# Patient Record
Sex: Female | Born: 1980 | Race: Black or African American | Hispanic: No | Marital: Single | Smoking: Current every day smoker
Health system: Southern US, Community
[De-identification: ages and names within clinical notes are randomized; demographics above are authoritative.]

## PROBLEM LIST (undated history)

## (undated) DIAGNOSIS — I10 Essential (primary) hypertension: Secondary | ICD-10-CM

## (undated) HISTORY — PX: BREAST SURGERY: SHX581

---

## 2013-02-05 ENCOUNTER — Ambulatory Visit: Payer: Self-pay | Admitting: Emergency Medicine

## 2013-02-05 VITALS — BP 132/94 | HR 86 | Temp 98.4°F | Resp 20 | Ht 66.0 in | Wt 246.0 lb

## 2013-02-05 DIAGNOSIS — L02219 Cutaneous abscess of trunk, unspecified: Secondary | ICD-10-CM

## 2013-02-05 MED ORDER — SULFAMETHOXAZOLE-TRIMETHOPRIM 800-160 MG PO TABS: 1.0000 | ORAL_TABLET | Freq: Two times a day (BID) | ORAL | Status: DC

## 2013-02-05 MED ORDER — HYDROCODONE-ACETAMINOPHEN 5-325 MG PO TABS: 1.0000 | ORAL_TABLET | ORAL | Status: DC | PRN

## 2013-02-05 NOTE — Progress Notes (Signed)
Right Axillary Abscess Procedural Note   Local anesthesia with 3.5 cc with 2% plain lidocaine  Sterile prep  Incision with 11 blade  Moderate to large thick purulence expressed  Irrigated wound cavity with 1.5 cc 2% plain lidocaine Gently packed with large amount of 1/4 inch plain packing  Cleansed and dressed

## 2013-02-05 NOTE — Progress Notes (Signed)
[facility] [address], [place] [address] [phone]  Date:  02/05/2013   Name:  Gabriella Murphy   DOB:  [birth year]   MRN:  [id number]  PCP:  No primary provider on file.    Chief Complaint: Cyst   History of Present Illness:  Gabriella Murphy is a 32 y.o. very pleasant female patient who presents with the following:  History of prior outbreaks of axillary abscesses.  Has a one week history of painful swelling in right axilla.  No fever or chills.  Spontaneously drained.  No improvement with over the counter medications or other home remedies. Denies other complaint or health concern today.   There are no active problems to display for this patient.   History reviewed. No pertinent past medical history.  Past Surgical History  Procedure Laterality Date  . Breast surgery      History  Substance Use Topics  . Smoking status: Current Every Day Smoker -- 0.50 packs/day    Types: Cigarettes  . Smokeless tobacco: Not on file  . Alcohol Use: Yes    Family History  Problem Relation Age of Onset  . Hyperlipidemia Mother   . Diabetes Maternal Grandmother   . Hyperlipidemia Maternal Grandmother   . Heart disease Maternal Grandfather   . Hyperlipidemia Maternal Grandfather   . Stroke Maternal Grandfather   . Diabetes Paternal Grandmother   . Heart disease Paternal Grandmother   . Hyperlipidemia Paternal Grandmother   . Stroke Paternal Grandmother     No Known Allergies  Medication list has been reviewed and updated.  No current outpatient prescriptions on file prior to visit.   No current facility-administered medications on file prior to visit.    Review of Systems:  As per HPI, otherwise negative.    Physical Examination: Filed Vitals:   02/05/13 0931  BP: 132/94  Pulse: 86  Temp: 98.4 F (36.9 C)  Resp: 20   Filed Vitals:   02/05/13 0931  Height: 5\' 6"  (1.676 m)  Weight: 246 lb (111.585 kg)   Body mass index is 39.72  kg/(m^2). Ideal Body Weight: Weight in (lb) to have BMI = 25: 154.6   GEN: WDWN, NAD, Non-toxic, Alert & Oriented x 3 HEENT: Atraumatic, Normocephalic.  Ears and Nose: No external deformity. EXTR: No clubbing/cyanosis/edema NEURO: Normal gait.  PSYCH: Normally interactive. Conversant. Not depressed or anxious appearing.  Calm demeanor.  Hydradenitis suppurativa right axilla.   Large abscess.  Cannot lower her arm due to pain.  Assessment and Plan: Hydradenitis suppurative I&D Septra Hydrocodone Local heat  Signed,  Phillips Odor, MD

## 2013-02-05 NOTE — Progress Notes (Signed)
I directly supervised and participated in the procedure and agree with the student's documentation.  

## 2013-02-05 NOTE — Patient Instructions (Addendum)
INSTRUCTIONS: Apply a warm compress to the area for 15-20 minutes 2-4 times/day (over the dressing) Change the dressing daily or if it becomes saturated, wet, or leaks Return in 2 days     Abscess An abscess is an infected area that contains a collection of pus and debris.It can occur in almost any part of the body. An abscess is also known as a furuncle or boil. CAUSES  An abscess occurs when tissue gets infected. This can occur from blockage of oil or sweat glands, infection of hair follicles, or a minor injury to the skin. As the body tries to fight the infection, pus collects in the area and creates pressure under the skin. This pressure causes pain. People with weakened immune systems have difficulty fighting infections and get certain abscesses more often.  SYMPTOMS Usually an abscess develops on the skin and becomes a painful mass that is red, warm, and tender. If the abscess forms under the skin, you may feel a moveable soft area under the skin. Some abscesses break open (rupture) on their own, but most will continue to get worse without care. The infection can spread deeper into the body and eventually into the bloodstream, causing you to feel ill.  DIAGNOSIS  Your caregiver will take your medical history and perform a physical exam. A sample of fluid may also be taken from the abscess to determine what is causing your infection. TREATMENT  Your caregiver may prescribe antibiotic medicines to fight the infection. However, taking antibiotics alone usually does not cure an abscess. Your caregiver may need to make a small cut (incision) in the abscess to drain the pus. In some cases, gauze is packed into the abscess to reduce pain and to continue draining the area. HOME CARE INSTRUCTIONS   Only take over-the-counter or prescription medicines for pain, discomfort, or fever as directed by your caregiver.  If you were prescribed antibiotics, take them as directed. Finish them even if you  start to feel better.  If gauze is used, follow your caregiver's directions for changing the gauze.  To avoid spreading the infection:  Keep your draining abscess covered with a bandage.  Wash your hands well.  Do not share personal care items, towels, or whirlpools with others.  Avoid skin contact with others.  Keep your skin and clothes clean around the abscess.  Keep all follow-up appointments as directed by your caregiver. SEEK MEDICAL CARE IF:   You have increased pain, swelling, redness, fluid drainage, or bleeding.  You have muscle aches, chills, or a general ill feeling.  You have a fever. MAKE SURE YOU:   Understand these instructions.  Will watch your condition.  Will get help right away if you are not doing well or get worse. Document Released: 12/26/2004 Document Revised: 09/17/2011 Document Reviewed: 05/31/2011 [facility] Patient Information 2014 [place], [place].

## 2013-02-07 ENCOUNTER — Ambulatory Visit (INDEPENDENT_AMBULATORY_CARE_PROVIDER_SITE_OTHER): Payer: Self-pay | Admitting: Physician Assistant

## 2013-02-07 VITALS — BP 124/84 | HR 81 | Temp 98.0°F | Resp 16 | Ht 66.0 in | Wt 248.0 lb

## 2013-02-07 DIAGNOSIS — L02419 Cutaneous abscess of limb, unspecified: Secondary | ICD-10-CM

## 2013-02-07 DIAGNOSIS — IMO0002 Reserved for concepts with insufficient information to code with codable children: Secondary | ICD-10-CM

## 2013-02-07 NOTE — Progress Notes (Signed)
Patient ID: Gabriella Murphy MRN: [id number], DOB: [birth year] 32 y.o. Date of Encounter: 02/07/2013, 1:51 PM  Chief Complaint: Wound care   See previous note  HPI: 32 y.o. y/o female presents for wound care s/p I&D on 02/05/13.  Doing well No issues or complaints Afebrile/ no chills No nausea or vomiting Tolerating Septra Pain improved. She is still having pain with ROM of her arm, but is able to put her arm down now.  Daily dressing change Previous note reviewed  History reviewed. No pertinent past medical history.   Home Meds: Prior to Admission medications   Medication Sig Start Date End Date Taking? Authorizing Provider  HYDROcodone-acetaminophen (NORCO) 5-325 MG per tablet Take 1-2 tablets by mouth every 4 (four) hours as needed for moderate pain. 02/05/13  Yes Phillips Odor, MD  sulfamethoxazole-trimethoprim (BACTRIM DS,SEPTRA DS) 800-160 MG per tablet Take 1 tablet by mouth 2 (two) times daily. 02/05/13  Yes Phillips Odor, MD    Allergies: No Known Allergies  ROS: Constitutional: Afebrile, no chills Cardiovascular: negative for chest pain or palpitations Dermatological: Positive for wound. Negative for erythema, pain, or warmth.  GI: No nausea or vomiting   EXAM: Physical Exam: Blood pressure 124/84, pulse 81, temperature 98 F (36.7 C), temperature source Oral, resp. rate 16, height 5\' 6"  (1.676 m), weight 248 lb (112.492 kg), last menstrual period 01/13/2013, SpO2 99.00%., Body mass index is 40.05 kg/(m^2). General: Well developed, well nourished, in no acute distress. Nontoxic appearing. Head: Normocephalic, atraumatic, sclera non-icteric.  Neck: Supple. Lungs: Breathing is unlabored. Heart: Normal rate. Skin:  Warm and moist. Dressing and packing in place. There is some surrounding induration and tenderness to palpation. No erythema or warmth.  Neuro: Alert and oriented X 3. Moves all extremities spontaneously. Normal gait.  Psych:  Responds to questions  appropriately with a normal affect.       PROCEDURE: Dressing and packing removed. Copious amount of purulence on dressing.  No purulence expressed Wound bed healthy Irrigated with 1% plain lidocaine 5 cc. Repacked with 1/4 plain packing.  Dressing applied  LAB: Culture: not collected.   A/P: 32 y.o. y/o female with axillary cellulitis/abscess as above s/p I&D on 02/05/13.  Wound care per above Continue Septra Pain well controlled Daily dressing changes Recheck 48 hours  Signed, Rhoderick Moody, PA-C 02/07/2013 1:51 PM

## 2013-02-09 ENCOUNTER — Ambulatory Visit (INDEPENDENT_AMBULATORY_CARE_PROVIDER_SITE_OTHER): Payer: Self-pay | Admitting: Physician Assistant

## 2013-02-09 VITALS — BP 120/78 | HR 94 | Temp 98.5°F | Resp 18 | Ht 66.25 in | Wt 246.6 lb

## 2013-02-09 DIAGNOSIS — IMO0002 Reserved for concepts with insufficient information to code with codable children: Secondary | ICD-10-CM

## 2013-02-09 DIAGNOSIS — L02419 Cutaneous abscess of limb, unspecified: Secondary | ICD-10-CM

## 2013-02-09 NOTE — Progress Notes (Signed)
  Subjective:    Patient ID: Gabriella Murphy, female    DOB: 1981-01-20, 32 y.o.   MRN: [id number]  HPI 32 y/o female presents for wound care s/p I&D on 02/05/13 of right axilla.  She states she is doing well , with no issues or complaints.  She denies any fever or chills, and tolerating Septra. She states she had mild nausea with her initial dose of Septra but attributes this to not taking it with food.  She denies any nausea since her initial dose. Her pain has improved with mild soreness with ROM of right arm.She is still doing daily dressing changes. Previous note reviewed    Review of Systems  Constitutional: Negative for fever, chills and fatigue.  Gastrointestinal: Negative for nausea, vomiting, abdominal pain and diarrhea.  Skin: Positive for wound. Negative for color change, pallor and rash.       Objective:   Physical Exam  Ms. Goller is a pleasant well-developed, well-nourished African American female  in no acute distress Skin: Dressing and packing in place.  There is mild induration and tenderness to palpation to surrounding areas in right axilla.  No erythema or warmth noted.    BP 120/78  Pulse 94  Temp(Src) 98.5 F (36.9 C) (Oral)  Resp 18  Ht 5' 6.25" (1.683 m)  Wt 246 lb 9.6 oz (111.857 kg)  BMI 39.49 kg/m2  SpO2 97%  LMP 01/13/2013   Procedure Dressing and packing removed.  Large amount of purulence on dressing.  Small amount of purulence expressed.  Wound bed healthy, irrigated with 3 cc of 2% plain lidocaine.  Repacked with 1/4 plain packing.  Dressing applied.     Assessment & Plan:  Axillary abscess 32 YO female with right axillary abscess s/p I&D on 02/05/13.  Plan: Continue current plan.  RTC in 2 days to re-evaluate.  Continue Septra.  Daily dressing changes.  Pain is well controlled.

## 2013-02-10 NOTE — Progress Notes (Signed)
I examined this patient, directly supervised and participated in the procedure and agree with the student's documentation.

## 2013-02-11 ENCOUNTER — Ambulatory Visit (INDEPENDENT_AMBULATORY_CARE_PROVIDER_SITE_OTHER): Payer: Self-pay | Admitting: Physician Assistant

## 2013-02-11 VITALS — BP 104/78 | HR 88 | Temp 98.8°F | Resp 20 | Ht 66.25 in | Wt 246.0 lb

## 2013-02-11 DIAGNOSIS — IMO0002 Reserved for concepts with insufficient information to code with codable children: Secondary | ICD-10-CM

## 2013-02-11 DIAGNOSIS — L02419 Cutaneous abscess of limb, unspecified: Secondary | ICD-10-CM

## 2013-02-11 NOTE — Progress Notes (Signed)
   Patient ID: Hannahmarie Asberry MRN: [id number], DOB: July 12, 1980 32 y.o. Date of Encounter: 02/11/2013, 2:13 PM  Primary Physician: No PCP Per Patient  Chief Complaint: Wound care   See previous note  HPI: 32 y.o. female presents for wound care s/p I&D on 02/05/13 Doing well No issues or complaints Afebrile/ no chills No nausea or vomiting Tolerating Bactrim DS Pain almost fully resolved Daily dressing change Previous note reviewed  History reviewed. No pertinent past medical history.   Home Meds: Prior to Admission medications   Medication Sig Start Date End Date Taking? Authorizing Provider  HYDROcodone-acetaminophen (NORCO) 5-325 MG per tablet Take 1-2 tablets by mouth every 4 (four) hours as needed for moderate pain. 02/05/13  Yes Phillips Odor, MD  sulfamethoxazole-trimethoprim (BACTRIM DS,SEPTRA DS) 800-160 MG per tablet Take 1 tablet by mouth 2 (two) times daily. 02/05/13  Yes Phillips Odor, MD    Allergies: No Known Allergies  ROS: Constitutional: Afebrile, no chills Cardiovascular: negative for chest pain or palpitations Dermatological: Positive for wound GI: No nausea or vomiting   EXAM: Physical Exam: Blood pressure 104/78, pulse 88, temperature 98.8 F (37.1 C), temperature source Oral, resp. rate 20, height 5' 6.25" (1.683 m), weight 246 lb (111.585 kg), last menstrual period 01/13/2013, SpO2 99.00%., Body mass index is 39.39 kg/(m^2). General: Well developed, well nourished, in no acute distress. Nontoxic appearing. Head: Normocephalic, atraumatic, sclera non-icteric.  Neck: Supple. Lungs: Breathing is unlabored. Heart: Normal rate. Skin:  Warm and moist. Dressing and packing in place. Moderate amount of purulence on the dressing. Local induration without erythema or tenderness to palpation. Neuro: Alert and oriented X 3. Moves all extremities spontaneously. Normal gait.  Psych:  Responds to questions appropriately with a normal affect.    PROCEDURE: Dressing and packing removed. Small amount purulence expressed Wound bed healthy Irrigated with 1% plain lidocaine 5 cc. Repacked with moderate amount of 1/4 inch plain packing Dressing applied  LAB: Culture: Not done  A/P: 32 y.o. female with cellulitis/abscess as above s/p I&D on 02/05/13 -Wound care per above -Continue Bactrim DS -Pain well controlled -Daily dressing changes -Recheck 48 hours  Signed, Eula Listen, PA-C [facility] [place], [place] [address] [phone] 02/11/2013 2:13 PM

## 2013-02-14 ENCOUNTER — Ambulatory Visit (INDEPENDENT_AMBULATORY_CARE_PROVIDER_SITE_OTHER): Payer: Self-pay | Admitting: Physician Assistant

## 2013-02-14 VITALS — BP 110/70 | HR 79 | Temp 98.5°F | Resp 16 | Ht 66.5 in | Wt 245.0 lb

## 2013-02-14 DIAGNOSIS — IMO0002 Reserved for concepts with insufficient information to code with codable children: Secondary | ICD-10-CM

## 2013-02-14 DIAGNOSIS — L02419 Cutaneous abscess of limb, unspecified: Secondary | ICD-10-CM

## 2013-02-14 NOTE — Progress Notes (Signed)
Patient ID: Gabriella Murphy MRN: [id number], DOB: 25-Sep-1980 32 y.o. Date of Encounter: 02/14/2013, 12:23 PM  Chief Complaint: Wound care   See previous note  HPI: 32 y.o. y/o female presents for wound care s/p I&D on 02/05/13.  Doing well No issues or complaints Afebrile/ no chills No nausea or vomiting Tolerating Septra Pain improved significantly.  Daily dressing change Previous note reviewed  History reviewed. No pertinent past medical history.   Home Meds: Prior to Admission medications   Medication Sig Start Date End Date Taking? Authorizing Provider  HYDROcodone-acetaminophen (NORCO) 5-325 MG per tablet Take 1-2 tablets by mouth every 4 (four) hours as needed for moderate pain. 02/05/13  Yes Phillips Odor, MD  sulfamethoxazole-trimethoprim (BACTRIM DS,SEPTRA DS) 800-160 MG per tablet Take 1 tablet by mouth 2 (two) times daily. 02/05/13  Yes Phillips Odor, MD    Allergies: No Known Allergies  ROS: Constitutional: Afebrile, no chills Cardiovascular: negative for chest pain or palpitations Dermatological: Positive for wound. Negative for erythema, pain, or warmth.  GI: No nausea or vomiting   EXAM: Physical Exam: Blood pressure 110/70, pulse 79, temperature 98.5 F (36.9 C), temperature source Oral, resp. rate 16, height 5' 6.5" (1.689 m), weight 245 lb (111.131 kg), last menstrual period 01/13/2013, SpO2 100.00%., Body mass index is 38.96 kg/(m^2). General: Well developed, well nourished, in no acute distress. Nontoxic appearing. Head: Normocephalic, atraumatic, sclera non-icteric.  Neck: Supple. Lungs: Breathing is unlabored. Heart: Normal rate. Skin:  Warm and moist. Dressing and packing in place. There is induration surrounding wound. No erythema, warmth, or tenderness to palpation. Neuro: Alert and oriented X 3. Moves all extremities spontaneously. Normal gait.  Psych:  Responds to questions appropriately with a normal affect.       PROCEDURE: Dressing  and packing removed. Moderate amount of purulence expressed Wound bed healthy Irrigated with 1% plain lidocaine 5 cc. Repacked with 1/4 plain packing. Dressing applied  LAB: Culture: not done  A/P: 32 y.o. y/o female with axillary cellulitis/abscess as above s/p I&D on 02/05/13.  Wound care per above Continue Septra Pain well controlled Daily dressing changes Recheck 72 hours  Signed, Rhoderick Moody, PA-C 02/14/2013 12:23 PM

## 2013-02-17 ENCOUNTER — Ambulatory Visit (INDEPENDENT_AMBULATORY_CARE_PROVIDER_SITE_OTHER): Payer: Self-pay | Admitting: Physician Assistant

## 2013-02-17 VITALS — BP 122/78 | HR 85 | Temp 99.2°F | Resp 16 | Ht 66.0 in | Wt 245.0 lb

## 2013-02-17 DIAGNOSIS — IMO0002 Reserved for concepts with insufficient information to code with codable children: Secondary | ICD-10-CM

## 2013-02-17 DIAGNOSIS — L02419 Cutaneous abscess of limb, unspecified: Secondary | ICD-10-CM

## 2013-02-17 NOTE — Progress Notes (Signed)
Patient ID: Gabriella Murphy MRN: [id number], DOB: [birth year] 32 y.o. Date of Encounter: 02/17/2013, 9:25 AM  Chief Complaint: Wound care   See previous note  HPI: 32 y.o. y/o female presents for wound care s/p I&D on 02/05/13 Doing well No issues or complaints Afebrile/ no chills No nausea or vomiting Tolerating Septra - last dose today Pain resolved.  Daily dressing change Previous note reviewed  History reviewed. No pertinent past medical history.   Home Meds: Prior to Admission medications   Medication Sig Start Date End Date Taking? Authorizing Provider  HYDROcodone-acetaminophen (NORCO) 5-325 MG per tablet Take 1-2 tablets by mouth every 4 (four) hours as needed for moderate pain. 02/05/13  Yes Phillips Odor, MD  sulfamethoxazole-trimethoprim (BACTRIM DS,SEPTRA DS) 800-160 MG per tablet Take 1 tablet by mouth 2 (two) times daily. 02/05/13  Yes Phillips Odor, MD    Allergies: No Known Allergies  ROS: Constitutional: Afebrile, no chills Cardiovascular: negative for chest pain or palpitations Dermatological: Positive for wound. Negative for erythema, pain, or warmth.  GI: No nausea or vomiting   EXAM: Physical Exam: Blood pressure 122/78, pulse 85, temperature 99.2 F (37.3 C), temperature source Oral, resp. rate 16, height 5\' 6"  (1.676 m), weight 245 lb (111.131 kg), last menstrual period 02/15/2013, SpO2 98.00%., Body mass index is 39.56 kg/(m^2). General: Well developed, well nourished, in no acute distress. Nontoxic appearing. Head: Normocephalic, atraumatic, sclera non-icteric.  Neck: Supple. Lungs: Breathing is unlabored. Heart: Normal rate. Skin:  Warm and moist. Dressing and packing in place. + induration. No erythema, fluctuance, or tenderness to palpation. Neuro: Alert and oriented X 3. Moves all extremities spontaneously. Normal gait.  Psych:  Responds to questions appropriately with a normal affect.       PROCEDURE: Dressing and packing removed. No  purulence expressed Wound bed healthy Irrigated with 1% plain lidocaine 5 cc. Repacked with small amount of 1/4 plain packing.  Dressing applied  LAB: Culture: not done  A/P: 32 y.o. y/o female with axillary cellulitis/abscess as above s/p I&D on 02/05/13.  Wound care per above Continue Septra.  Pain well controlled Daily dressing changes Recheck 48 hours if needed. Ok to remove packing at home and follow up as needed.   Grier Mitts, PA-C 02/17/2013 9:25 AM

## 2015-01-19 ENCOUNTER — Ambulatory Visit (INDEPENDENT_AMBULATORY_CARE_PROVIDER_SITE_OTHER): Payer: Self-pay | Admitting: Family Medicine

## 2015-01-19 VITALS — BP 134/98 | HR 70 | Temp 98.7°F | Resp 16 | Ht 66.0 in | Wt 232.0 lb

## 2015-01-19 DIAGNOSIS — L02219 Cutaneous abscess of trunk, unspecified: Secondary | ICD-10-CM

## 2015-01-19 DIAGNOSIS — L03319 Cellulitis of trunk, unspecified: Secondary | ICD-10-CM

## 2015-01-19 DIAGNOSIS — L732 Hidradenitis suppurativa: Secondary | ICD-10-CM

## 2015-01-19 NOTE — Patient Instructions (Signed)

## 2015-01-19 NOTE — Progress Notes (Signed)
Subjective:  This chart was scribed for Elvina Sidle MD, by Veverly Fells, at [facility].  This patient was seen in room 9 and the patient's care was started at 1:27 PM.    Patient ID: Gabriella Murphy, female    DOB: Aug 17, 1980, 34 y.o.   MRN: [id number] Chief Complaint  Patient presents with   wound care    abcess under left arm/ follow up    HPI  HPI Comments: Gabriella Murphy is a 34 y.o. female who [facility] care for an abcess that was drained in her left axillary region.  She is currently here to have the packing in the wound removed.  She states that she gets the abcesses in this area quite often. She is currently complaint with her antibiotics.  Patient is a Merchandiser, retail at [employer]. She has no other questions or concerns today.    There are no active problems to display for this patient.  History reviewed. No pertinent past medical history. Past Surgical History  Procedure Laterality Date   Breast surgery     No Known Allergies Prior to Admission medications   Medication Sig Start Date End Date Taking? Authorizing Provider  clindamycin (CLEOCIN) 300 MG capsule Take 300 mg by mouth 3 (three) times daily.   Yes Historical Provider, MD  HYDROcodone-acetaminophen (NORCO) 5-325 MG per tablet Take 1-2 tablets by mouth every 4 (four) hours as needed for moderate pain. 02/05/13  Yes Carmelina Dane, MD  sulfamethoxazole-trimethoprim (BACTRIM DS,SEPTRA DS) 800-160 MG per tablet Take 1 tablet by mouth 2 (two) times daily. 02/05/13  Yes Carmelina Dane, MD   Social History   Social History   Marital Status: Single    Spouse Name: N/A   Number of Children: N/A   Years of Education: N/A   Occupational History   Not on file.   Social History Main Topics   Smoking status: Current Every Day Smoker -- 0.50 packs/day    Types: Cigarettes   Smokeless tobacco: Not on file   Alcohol Use: Yes   Drug  Use: Yes   Sexual Activity: Not on file   Other Topics Concern   Not on file   Social History Narrative    Review of Systems  Constitutional: Negative for fever and chills.  Eyes: Negative for pain, discharge, redness and itching.  Respiratory: Negative for cough, choking and shortness of breath.   Gastrointestinal: Negative for nausea and vomiting.  Musculoskeletal: Negative for neck pain and neck stiffness.  Skin: Positive for wound.  Neurological: Negative for syncope and speech difficulty.       Objective:   Physical Exam  Constitutional: She is oriented to person, place, and time. She appears well-developed and well-nourished. No distress.  HENT:  Head: Normocephalic and atraumatic.  Eyes: Pupils are equal, round, and reactive to light.  Neck: Normal range of motion.  Pulmonary/Chest: Effort normal. No respiratory distress.  Musculoskeletal: Normal range of motion.  Neurological: She is alert and oriented to person, place, and time.  Skin: Skin is warm and dry.  Packing removed, no significant drainage under left axillary area.     Filed Vitals:   01/19/15 1315  BP: 134/98  Pulse: 70  Temp: 98.7 F (37.1 C)  TempSrc: Oral  Resp: 16  Height:  (1.676 m)  Weight: 232 lb (105.235 kg)  SpO2: 98%       Assessment & Plan:   [employer]  antibiotics.   This chart was scribed in my presence and reviewed by me personally.    ICD-9-CM ICD-10-CM   1. Cellulitis and abscess of trunk 682.2 L03.319     L02.219   2. Hidradenitis axillaris 705.83 L73.2      Signed, Elvina SidleKurt Lauenstein, MD

## 2015-09-28 ENCOUNTER — Ambulatory Visit (INDEPENDENT_AMBULATORY_CARE_PROVIDER_SITE_OTHER): Payer: BLUE CROSS/BLUE SHIELD | Admitting: Family Medicine

## 2015-09-28 VITALS — BP 140/88 | HR 91 | Temp 98.5°F | Resp 18 | Ht 66.0 in | Wt 227.0 lb

## 2015-09-28 DIAGNOSIS — L02419 Cutaneous abscess of limb, unspecified: Secondary | ICD-10-CM | POA: Diagnosis not present

## 2015-09-28 DIAGNOSIS — R2231 Localized swelling, mass and lump, right upper limb: Secondary | ICD-10-CM

## 2015-09-28 MED ORDER — AMOXICILLIN 875 MG PO TABS
875.0000 mg | ORAL_TABLET | Freq: Two times a day (BID) | ORAL | Status: DC
Start: 2015-09-28 — End: 2016-12-19

## 2015-09-28 MED ORDER — HYDROCODONE-ACETAMINOPHEN 5-325 MG PO TABS: 1.0000 | ORAL_TABLET | ORAL | Status: DC | PRN

## 2015-09-28 NOTE — Patient Instructions (Addendum)
Please apply warm compresses in a ziptop bag 3 times a day Change dressing tomorrow and clean with soap and water, reapply dressing daily until wound healed  Abscess An abscess is an infected area that contains a collection of pus and debris.It can occur in almost any part of the body. An abscess is also known as a furuncle or boil. CAUSES  An abscess occurs when tissue gets infected. This can occur from blockage of oil or sweat glands, infection of hair follicles, or a minor injury to the skin. As the body tries to fight the infection, pus collects in the area and creates pressure under the skin. This pressure causes pain. People with weakened immune systems have difficulty fighting infections and get certain abscesses more often.  SYMPTOMS Usually an abscess develops on the skin and becomes a painful mass that is red, warm, and tender. If the abscess forms under the skin, you may feel a moveable soft area under the skin. Some abscesses break open (rupture) on their own, but most will continue to get worse without care. The infection can spread deeper into the body and eventually into the bloodstream, causing you to feel ill.  DIAGNOSIS  Your caregiver will take your medical history and perform a physical exam. A sample of fluid may also be taken from the abscess to determine what is causing your infection. TREATMENT  Your caregiver may prescribe antibiotic medicines to fight the infection. However, taking antibiotics alone usually does not cure an abscess. Your caregiver may need to make a small cut (incision) in the abscess to drain the pus. In some cases, gauze is packed into the abscess to reduce pain and to continue draining the area. HOME CARE INSTRUCTIONS   Only take over-the-counter or prescription medicines for pain, discomfort, or fever as directed by your caregiver.  If you were prescribed antibiotics, take them as directed. Finish them even if you start to feel better.  If gauze is  used, follow your caregiver's directions for changing the gauze.  To avoid spreading the infection:  Keep your draining abscess covered with a bandage.  Wash your hands well.  Do not share personal care items, towels, or whirlpools with others.  Avoid skin contact with others.  Keep your skin and clothes clean around the abscess.  Keep all follow-up appointments as directed by your caregiver. SEEK MEDICAL CARE IF:   You have increased pain, swelling, redness, fluid drainage, or bleeding.  You have muscle aches, chills, or a general ill feeling.  You have a fever. MAKE SURE YOU:   Understand these instructions.  Will watch your condition.  Will get help right away if you are not doing well or get worse.   This information is not intended to replace advice given to you by your health care provider. Make sure you discuss any questions you have with your health care provider.   Document Released: 12/26/2004 Document Revised: 09/17/2011 Document Reviewed: 05/31/2011 Elsevier Interactive Patient Education 2016 [employer]    IF you received an x-ray today, you will receive an invoice from [facility]. Please contact [facility] at [phone] with questions or concerns regarding your invoice.   IF you received labwork today, you will receive an invoice from [facility]. Please contact [facility] at [phone] with questions or concerns regarding your invoice.   Our billing staff will not be able to assist you with questions regarding bills from these companies.  You will be contacted with the lab results  as soon as they are available. The fastest way to get your results is to activate your My Chart account. Instructions are located on the last page of this paperwork. If you have not heard from [place] regarding the results in 2 weeks, please contact this office.

## 2015-09-28 NOTE — Progress Notes (Signed)
   Subjective:    Patient ID: Gabriella AltesSamilia Chicas, female    DOB: 12/21/1980, 35 y.o.   MRN: [id number]  HPI This is a 35 yo female who presents today with right axillary pain and swelling. She has underlying, chronic abscesses under both arms requiring periodic I&D. She has taken ibuprofen 400 mg without relief of pain. She has not applied warm compresses.   She is sexually active, no using contraception. Not particularly interested in having a child at this time.    No past medical history on file. Past Surgical History  Procedure Laterality Date  . Breast surgery     Family History  Problem Relation Age of Onset  . Hyperlipidemia Mother   . Diabetes Maternal Grandmother   . Hyperlipidemia Maternal Grandmother   . Heart disease Maternal Grandfather   . Hyperlipidemia Maternal Grandfather   . Stroke Maternal Grandfather   . Diabetes Paternal Grandmother   . Heart disease Paternal Grandmother   . Hyperlipidemia Paternal Grandmother   . Stroke Paternal Grandmother    Social History  Substance Use Topics  . Smoking status: Current Every Day Smoker -- 0.50 packs/day    Types: Cigarettes  . Smokeless tobacco: None  . Alcohol Use: Yes      Review of Systems Per HPI    Objective:   Physical Exam  Constitutional: She is oriented to person, place, and time. She appears well-developed and well-nourished.  HENT:  Head: Normocephalic and atraumatic.  Eyes: Conjunctivae are normal.  Cardiovascular: Normal rate.   Pulmonary/Chest: Effort normal.  Neurological: She is alert and oriented to person, place, and time.  Skin: Skin is warm and dry.  Right axilla with large, ovoid area of swelling and tenderness. Slight erythema. Left axilla with small amount firm swelling.   Psychiatric: She has a normal mood and affect. Her behavior is normal. Judgment and thought content normal.  Vitals reviewed.     BP 140/88 mmHg  Pulse 91  Temp(Src) 98.5 F (36.9 C) (Oral)  Resp 18  Ht 5\' 6"   (1.676 m)  Wt 227 lb (102.967 kg)  BMI 36.66 kg/m2  SpO2 100%  LMP 09/24/2015 Wt Readings from Last 3 Encounters:  09/28/15 227 lb (102.967 kg)  01/19/15 232 lb (105.235 kg)  02/17/13 245 lb (111.131 kg)  Procedure note: VCO, area cleaned with betadine. Area numbed with lidocaine 2% with epi. Incision made and small amount purulent drainage noted. Wound culture obtained. Area explored, no tracts found. Not deep enough to require packing. Warm compress applied. Area dried and dressing applied.      Assessment & Plan:  1. Axillary abscess - discussed importance of smoking cessation and healthy food choices, can wash with salicylic acid wash - wound care instructions provided - amoxicillin (AMOXIL) 875 MG tablet; Take 1 tablet (875 mg total) by mouth 2 (two) times daily.  Dispense: 20 tablet; Refill: 0 - HYDROcodone-acetaminophen (NORCO) 5-325 MG tablet; Take 1 tablet by mouth every 4 (four) hours as needed for moderate pain.  Dispense: 10 tablet; Refill: 0 - WOUND CULTURE - RTC if no improvement in 3-5 days, sooner if worsening symptoms.   Olean Reeeborah Gessner, FNP-BC  [facility]  09/28/2015 5:13 PM

## 2015-09-30 LAB — WOUND CULTURE
Gram Stain: NONE SEEN
Gram Stain: NONE SEEN
Gram Stain: NONE SEEN

## 2015-10-04 ENCOUNTER — Telehealth: Payer: Self-pay | Admitting: *Deleted

## 2015-10-04 NOTE — Telephone Encounter (Signed)
Spoke with patient informed results.  Patient understood.

## 2016-08-26 ENCOUNTER — Encounter (HOSPITAL_COMMUNITY): Payer: Self-pay | Admitting: *Deleted

## 2016-08-26 ENCOUNTER — Emergency Department (HOSPITAL_COMMUNITY)
Admission: EM | Admit: 2016-08-26 | Discharge: 2016-08-26 | Disposition: A | Discharge status: Home / Self Care | Payer: Self-pay | Attending: Emergency Medicine | Admitting: Emergency Medicine

## 2016-08-26 DIAGNOSIS — L02412 Cutaneous abscess of left axilla: Secondary | ICD-10-CM | POA: Insufficient documentation

## 2016-08-26 DIAGNOSIS — L02411 Cutaneous abscess of right axilla: Secondary | ICD-10-CM

## 2016-08-26 DIAGNOSIS — Z79899 Other long term (current) drug therapy: Secondary | ICD-10-CM | POA: Insufficient documentation

## 2016-08-26 DIAGNOSIS — L02413 Cutaneous abscess of right upper limb: Secondary | ICD-10-CM | POA: Insufficient documentation

## 2016-08-26 DIAGNOSIS — F17210 Nicotine dependence, cigarettes, uncomplicated: Secondary | ICD-10-CM | POA: Insufficient documentation

## 2016-08-26 MED ORDER — SULFAMETHOXAZOLE-TRIMETHOPRIM 800-160 MG PO TABS: 1.0000 | ORAL_TABLET | Freq: Two times a day (BID) | ORAL | 0 refills | Status: AC

## 2016-08-26 MED ORDER — OXYCODONE-ACETAMINOPHEN 5-325 MG PO TABS
1.0000 | ORAL_TABLET | Freq: Once | ORAL | Status: AC
  Administered 2016-08-26: 1 via ORAL
  Filled 2016-08-26: qty 1

## 2016-08-26 MED ORDER — CEPHALEXIN 500 MG PO CAPS: 500.0000 mg | ORAL_CAPSULE | Freq: Four times a day (QID) | ORAL | 0 refills | Status: DC

## 2016-08-26 MED ORDER — HYDROCODONE-ACETAMINOPHEN 5-325 MG PO TABS: 1.0000 | ORAL_TABLET | ORAL | 0 refills | Status: DC | PRN

## 2016-08-26 NOTE — ED Triage Notes (Signed)
Pt in from UC for R arm abcess, pt had I&D under L arm today, was sent here for eval of abcess under the R arm, pt hx of the same, A&O x4, denies fever, c/o  chills

## 2016-08-26 NOTE — ED Notes (Signed)
Patient able to ambulate independently  

## 2016-08-26 NOTE — Discharge Instructions (Signed)
Take your antibiotics as prescribed until completed. I recommend continuing to apply warm compresses to both of your armpits for 15-20 minutes 3-4 times daily. Follow-up with the general surgery clinic listed below for follow-up evaluation and further evaluation of your recurrent axilla abscesses. Please return to the Emergency Department if symptoms worsen or new onset of fever, worsening redness/swelling, vomiting.

## 2016-08-26 NOTE — ED Provider Notes (Signed)
[facility] Provider Note    By signing my name below, I, Earmon PhoenixJennifer Waddell, attest that this documentation has been prepared under the direction and in the presence of Melburn HakeNicole Leara Rawl, PA-C. Electronically Signed: Earmon PhoenixJennifer Waddell, ED Scribe. 08/26/16. 3:57 PM.    History   Chief Complaint Chief Complaint  Patient presents with  . Abscess   The history is provided by the patient and medical records. No language interpreter was used.    Gabriella Murphy is an obese 36 y.o. female with PMHx of recurrent abscesses who presents to the Emergency Department complaining of multiple abscesses to bilateral axilla that appeared several months ago. She reports associated severe pain and some purulent drainage from the areas. She has not taken anything for pain. Touching the areas increase the pain. She denies alleviating factors. She denies fever, chills, nausea, vomiting, abdominal pain. She denies any recent antibiotic therapy. She denies allergies to any medications. Patient reports she was initially seen at urgent care earlier today but was advised to come to the ED for further management.   History reviewed. No pertinent past medical history.  There are no active problems to display for this patient.   Past Surgical History:  Procedure Laterality Date  . BREAST SURGERY      OB History    No data available       Home Medications    Prior to Admission medications   Medication Sig Start Date End Date Taking? Authorizing Provider  amoxicillin (AMOXIL) 875 MG tablet Take 1 tablet (875 mg total) by mouth 2 (two) times daily. 09/28/15   Emi BelfastGessner, Deborah B, FNP  cephALEXin (KEFLEX) 500 MG capsule Take 1 capsule (500 mg total) by mouth 4 (four) times daily. 08/26/16   Barrett HenleNadeau, Khyler Eschmann Elizabeth, PA-C  HYDROcodone-acetaminophen (NORCO/VICODIN) 5-325 MG tablet Take 1 tablet by mouth every 4 (four) hours as needed. 08/26/16   Barrett HenleNadeau, Laiylah Roettger Elizabeth, PA-C  ibuprofen (ADVIL,MOTRIN) 100 MG  tablet Take 100 mg by mouth every 6 (six) hours as needed for fever.    [provider]  sulfamethoxazole-trimethoprim (BACTRIM DS,SEPTRA DS) 800-160 MG tablet Take 1 tablet by mouth 2 (two) times daily. 08/26/16 09/02/16  Barrett HenleNadeau, Jace Dowe Elizabeth, PA-C    Family History Family History  Problem Relation Age of Onset  . Hyperlipidemia Mother   . Diabetes Maternal Grandmother   . Hyperlipidemia Maternal Grandmother   . Heart disease Maternal Grandfather   . Hyperlipidemia Maternal Grandfather   . Stroke Maternal Grandfather   . Diabetes Paternal Grandmother   . Heart disease Paternal Grandmother   . Hyperlipidemia Paternal Grandmother   . Stroke Paternal Grandmother     Social History Social History  Substance Use Topics  . Smoking status: Current Every Day Smoker    Packs/day: 0.50    Types: Cigarettes  . Smokeless tobacco: Never Used  . Alcohol use Yes     Comment: occassional     Allergies   Patient has no known allergies.   Review of Systems Review of Systems  Constitutional: Negative for chills and fever.  Gastrointestinal: Negative for abdominal pain, nausea and vomiting.  Skin:       Abscess right axilla     Physical Exam Updated Vital Signs BP (!) 167/114 (BP Location: Right Arm)   Pulse 97   Temp 98.5 F (36.9 C) (Oral)   Resp 14   Ht 5\' 5"  (1.651 m)   Wt 220 lb (99.8 kg)   LMP 08/12/2016   SpO2 100%  BMI 36.61 kg/m   Physical Exam  Constitutional: She is oriented to person, place, and time. She appears well-developed and well-nourished.  HENT:  Head: Normocephalic and atraumatic.  Eyes: Conjunctivae and EOM are normal. Right eye exhibits no discharge. Left eye exhibits no discharge. No scleral icterus.  Neck: Normal range of motion. Neck supple.  Cardiovascular: Normal rate and intact distal pulses.   Pulmonary/Chest: Effort normal.  Musculoskeletal: Normal range of motion.  Neurological: She is alert and oriented to person, place, and  time.  Skin: Skin is warm and dry.  Multiple small <1cm abscesses present to bilateral axilla actively draining small amount of purulent drainage. TTP. No surrounding swelling, erythema or warmth.   Nursing note and vitals reviewed.    ED Treatments / Results  DIAGNOSTIC STUDIES: Oxygen Saturation is 100% on RA, normal by my interpretation.   COORDINATION OF CARE: 3:54 PM- Recommended frequent warm compresses. Will prescribe antibiotic. Will give referral to general surgeon. Pt verbalizes understanding and agrees to plan.  Medications  oxyCODONE-acetaminophen (PERCOCET/ROXICET) 5-325 MG per tablet 1 tablet (1 tablet Oral Given 08/26/16 1607)    Labs (all labs ordered are listed, but only abnormal results are displayed) Labs Reviewed - No data to display  EKG  EKG Interpretation None       Radiology No results found.  Procedures Procedures (including critical care time)  Medications Ordered in ED Medications  oxyCODONE-acetaminophen (PERCOCET/ROXICET) 5-325 MG per tablet 1 tablet (1 tablet Oral Given 08/26/16 1607)     Initial Impression / Assessment and Plan / ED Course  I have reviewed the triage vital signs and the nursing notes.  Pertinent labs & imaging results that were available during my care of the patient were reviewed by me and considered in my medical decision making (see chart for details).     Patient with recurrent skin abscess, actively draining on evaluation. I do not feel that I&D is warranted at this time as all abscesses present on exam (which are less than 1cm) are actively draining. No evidence of secondary cellulitis. However due to patient with history of recurrent abscesses plan to start patient on antibiotics. I also have given patient information for outpatient general surgery follow-up for evaluation and further management of her recurrent abscesses/hidradenitis suppurativa. Discussed wound care and continued warm compresses at home with  patient. Discussed strict return precautions.   Final Clinical Impressions(s) / ED Diagnoses   Final diagnoses:  Abscesses of both axillae    New Prescriptions New Prescriptions   CEPHALEXIN (KEFLEX) 500 MG CAPSULE    Take 1 capsule (500 mg total) by mouth 4 (four) times daily.   HYDROCODONE-ACETAMINOPHEN (NORCO/VICODIN) 5-325 MG TABLET    Take 1 tablet by mouth every 4 (four) hours as needed.   SULFAMETHOXAZOLE-TRIMETHOPRIM (BACTRIM DS,SEPTRA DS) 800-160 MG TABLET    Take 1 tablet by mouth 2 (two) times daily.    I personally performed the services described in this documentation, which was scribed in my presence. The recorded information has been reviewed and is accurate.     Barrett Henle, PA-C 08/26/16 1616    Raeford Razor, MD 09/01/16 [phone]

## 2016-12-19 ENCOUNTER — Encounter (HOSPITAL_COMMUNITY): Payer: Self-pay | Admitting: Emergency Medicine

## 2016-12-19 ENCOUNTER — Ambulatory Visit (HOSPITAL_COMMUNITY)
Admission: EM | Admit: 2016-12-19 | Discharge: 2016-12-19 | Disposition: A | Discharge status: Home / Self Care | Payer: BLUE CROSS/BLUE SHIELD | Attending: Nurse Practitioner | Admitting: Nurse Practitioner

## 2016-12-19 DIAGNOSIS — F17210 Nicotine dependence, cigarettes, uncomplicated: Secondary | ICD-10-CM | POA: Insufficient documentation

## 2016-12-19 DIAGNOSIS — R030 Elevated blood-pressure reading, without diagnosis of hypertension: Secondary | ICD-10-CM | POA: Diagnosis not present

## 2016-12-19 DIAGNOSIS — L732 Hidradenitis suppurativa: Secondary | ICD-10-CM | POA: Diagnosis not present

## 2016-12-19 DIAGNOSIS — L02411 Cutaneous abscess of right axilla: Secondary | ICD-10-CM | POA: Insufficient documentation

## 2016-12-19 MED ORDER — LIDOCAINE HCL (PF) 1 % IJ SOLN
5.0000 mL | Freq: Once | INTRAMUSCULAR | Status: AC
  Administered 2016-12-19: 5 mL via INTRADERMAL

## 2016-12-19 MED ORDER — SULFAMETHOXAZOLE-TRIMETHOPRIM 800-160 MG PO TABS: 1.0000 | ORAL_TABLET | Freq: Two times a day (BID) | ORAL | 0 refills | Status: AC

## 2016-12-19 NOTE — ED Provider Notes (Signed)
[facility]    CSN: [id number] Arrival date & time: 12/19/16  1531     History   Chief Complaint Chief Complaint  Patient presents with  . Abscess    HPI Gabriella Murphy is a 36 y.o. female.   Subjective:   Gabriella Murphy is a 36 y.o. female who presents for evaluation of a probable cutaneous abscess. Lesion is located in the right axilla. Symptoms have waxed and waned but are worse overall. Abscess has associated symptoms of spontaneous drainage and extensive pain. Patient has history of hidradenitis suppurativa to bilateral axillas. Patient does not have diabetes. Patient denies any fevers, sweats or chills.    Past medical history, family history, social history, allergies and current medications have been reviewed.           History reviewed. No pertinent past medical history.  There are no active problems to display for this patient.   Past Surgical History:  Procedure Laterality Date  . BREAST SURGERY      OB History    No data available       Home Medications    Prior to Admission medications   Medication Sig Start Date End Date Taking? Authorizing Provider  HYDROcodone-acetaminophen (NORCO/VICODIN) 5-325 MG tablet Take 1 tablet by mouth every 4 (four) hours as needed. 08/26/16   Barrett Henle, PA-C  ibuprofen (ADVIL,MOTRIN) 100 MG tablet Take 100 mg by mouth every 6 (six) hours as needed for fever.    [provider]    Family History Family History  Problem Relation Age of Onset  . Hyperlipidemia Mother   . Diabetes Maternal Grandmother   . Hyperlipidemia Maternal Grandmother   . Heart disease Maternal Grandfather   . Hyperlipidemia Maternal Grandfather   . Stroke Maternal Grandfather   . Diabetes Paternal Grandmother   . Heart disease Paternal Grandmother   . Hyperlipidemia Paternal Grandmother   . Stroke Paternal Grandmother     Social History Social History  Substance Use Topics  . Smoking status:  Current Every Day Smoker    Packs/day: 0.50    Types: Cigarettes  . Smokeless tobacco: Never Used  . Alcohol use Yes     Comment: occassional     Allergies   Patient has no known allergies.   Review of Systems Review of Systems  Constitutional: Negative for chills, diaphoresis and fever.  Skin:       Multiple painful abscesses in right axilla   All other systems reviewed and are negative.    Physical Exam Triage Vital Signs ED Triage Vitals  Enc Vitals Group     BP 12/19/16 1619 (!) 188/131     Pulse Rate 12/19/16 1619 65     Resp 12/19/16 1619 18     Temp 12/19/16 1619 98.4 F (36.9 C)     Temp Source 12/19/16 1619 Oral     SpO2 12/19/16 1619 99 %     Weight --      Height --      Head Circumference --      Peak Flow --      Pain Score 12/19/16 1617 8     Pain Loc --      Pain Edu? --      Excl. in GC? --    No data found.   Updated Vital Signs BP (!) 188/131 (BP Location: Left Arm) Comment: notified Cecilee Rosner, np of blood pressure value Comment (BP Location): regular cuff left forearm  Pulse 65  Temp 98.4 F (36.9 C) (Oral)   Resp 18   SpO2 99%   Visual Acuity Right Eye Distance:   Left Eye Distance:   Bilateral Distance:    Right Eye Near:   Left Eye Near:    Bilateral Near:     Physical Exam  Constitutional: She is oriented to person, place, and time. She appears well-developed and well-nourished.  Neck: Normal range of motion.  Cardiovascular: Normal rate, regular rhythm and normal heart sounds.   Pulmonary/Chest: Effort normal and breath sounds normal.  Musculoskeletal: Normal range of motion.  Neurological: She is alert and oriented to person, place, and time.  Skin: Skin is warm and dry.  There is extensive subcutaneous masses to the right axilla consistent with a hidradenitis suppurative. There is some induration, fluctuance, tenderness, purulent drainage noted.   Psychiatric: She has a normal mood and affect.     UC Treatments /  Results  Labs (all labs ordered are listed, but only abnormal results are displayed) Labs Reviewed  AEROBIC CULTURE (SUPERFICIAL SPECIMEN)    EKG  EKG Interpretation None       Radiology No results found.  Procedures .Marland KitchenIncision and Drainage Date/Time: 12/19/2016 4:56 PM Performed by: Lurline Idol Authorized by: Lurline Idol   Consent:    Consent obtained:  Verbal   Consent given by:  Patient   Risks discussed:  Bleeding, incomplete drainage, pain and infection   Alternatives discussed:  No treatment and referral Location:    Type:  Abscess   Location: right axilla  Pre-procedure details:    Skin preparation:  Betadine Anesthesia (see MAR for exact dosages):    Anesthesia method:  Local infiltration   Local anesthetic:  Lidocaine 1% WITH epi Procedure type:    Complexity:  Simple Procedure details:    Incision types:  Single straight   Incision depth:  Subcutaneous   Scalpel blade:  10   Wound management:  Probed and deloculated   Drainage:  Bloody and purulent   Drainage amount:  Moderate   Wound treatment:  Wound left open   Packing material: 1 inch iodoform gauze  Post-procedure details:    Patient tolerance of procedure:  Tolerated well, no immediate complications   (including critical care time)  Medications Ordered in UC Medications  lidocaine (PF) (XYLOCAINE) 1 % injection 5 mL (not administered)     Initial Impression / Assessment and Plan / UC Course  I have reviewed the triage vital signs and the nursing notes.  Pertinent labs & imaging results that were available during my care of the patient were reviewed by me and considered in my medical decision making (see chart for details).    36 year old female with extensive hidradenitis superlative with multiple cutaneous abscesses. Some spontaneous purulent drainage noted. I&D completed in the office; however, patient will need surgical consult as this is quite extensive. Referral given to  the skin surgery center. Wound culture sent. No systemic signs of toxicity noted at this time. Advised patient to apply hot compresses frequently to promote drainage. Will start Bactrim prophylactically. Patient incidentally found to be hypertensive. She denies any history of hypertension. She denies any blurred vision, limb paresthesias or headache. Patient advised to establish primary care for BP follow-up.  Discussed diagnosis and treatment with patient. All questions have been answered and all concerns have been addressed. The patient verbalized understanding and had no further questions   Final Clinical Impressions(s) / UC Diagnoses   Final diagnoses:  Abscess of axilla, right  Hydradenitis  Elevated blood-pressure reading without diagnosis of hypertension    New Prescriptions Current Discharge Medication List       Controlled Substance Prescriptions [place] Controlled Substance Registry consulted? Not Applicable   Lurline Idol, [place] 12/19/16 [phone]

## 2016-12-19 NOTE — ED Triage Notes (Signed)
Patient has an abscess to right axilla.  History of the same.  Reports draining since arriving at [facility].

## 2016-12-22 LAB — AEROBIC CULTURE  (SUPERFICIAL SPECIMEN)

## 2016-12-22 LAB — AEROBIC CULTURE W GRAM STAIN (SUPERFICIAL SPECIMEN): Culture: NORMAL

## 2017-06-30 ENCOUNTER — Emergency Department (HOSPITAL_COMMUNITY)
Admission: EM | Admit: 2017-06-30 | Discharge: 2017-06-30 | Disposition: A | Discharge status: Home / Self Care | Payer: BLUE CROSS/BLUE SHIELD | Attending: Emergency Medicine | Admitting: Emergency Medicine

## 2017-06-30 ENCOUNTER — Encounter (HOSPITAL_COMMUNITY): Payer: Self-pay

## 2017-06-30 ENCOUNTER — Emergency Department (HOSPITAL_COMMUNITY): Payer: BLUE CROSS/BLUE SHIELD

## 2017-06-30 ENCOUNTER — Other Ambulatory Visit: Payer: Self-pay

## 2017-06-30 DIAGNOSIS — L02411 Cutaneous abscess of right axilla: Secondary | ICD-10-CM | POA: Diagnosis not present

## 2017-06-30 DIAGNOSIS — L0291 Cutaneous abscess, unspecified: Secondary | ICD-10-CM

## 2017-06-30 DIAGNOSIS — F17210 Nicotine dependence, cigarettes, uncomplicated: Secondary | ICD-10-CM | POA: Diagnosis not present

## 2017-06-30 DIAGNOSIS — R0789 Other chest pain: Secondary | ICD-10-CM | POA: Diagnosis not present

## 2017-06-30 DIAGNOSIS — R079 Chest pain, unspecified: Secondary | ICD-10-CM

## 2017-06-30 DIAGNOSIS — I10 Essential (primary) hypertension: Secondary | ICD-10-CM | POA: Diagnosis not present

## 2017-06-30 DIAGNOSIS — M791 Myalgia, unspecified site: Secondary | ICD-10-CM | POA: Insufficient documentation

## 2017-06-30 DIAGNOSIS — M7918 Myalgia, other site: Secondary | ICD-10-CM

## 2017-06-30 LAB — CBC
HCT: 37.1 % (ref 36.0–46.0)
Hemoglobin: 11.9 g/dL — ABNORMAL LOW (ref 12.0–15.0)
MCH: 29.9 pg (ref 26.0–34.0)
MCHC: 32.1 g/dL (ref 30.0–36.0)
MCV: 93.2 fL (ref 78.0–100.0)
PLATELETS: 271 10*3/uL (ref 150–400)
RBC: 3.98 MIL/uL (ref 3.87–5.11)
RDW: 13.6 % (ref 11.5–15.5)
WBC: 6.9 10*3/uL (ref 4.0–10.5)

## 2017-06-30 LAB — BASIC METABOLIC PANEL
Anion gap: 7 (ref 5–15)
BUN: 11 mg/dL (ref 6–20)
CO2: 23 mmol/L (ref 22–32)
CREATININE: 0.83 mg/dL (ref 0.44–1.00)
Calcium: 9 mg/dL (ref 8.9–10.3)
Chloride: 106 mmol/L (ref 101–111)
GFR calc Af Amer: >60 mL/min (ref >60)
GFR calc non Af Amer: >60 mL/min (ref >60)
GLUCOSE: 84 mg/dL (ref 65–99)
POTASSIUM: 4.3 mmol/L (ref 3.5–5.1)
SODIUM: 136 mmol/L (ref 135–145)

## 2017-06-30 LAB — I-STAT BETA HCG BLOOD, ED (MC, WL, AP ONLY)

## 2017-06-30 LAB — I-STAT TROPONIN, ED: Troponin i, poc: 0 ng/mL (ref 0.00–0.08)

## 2017-06-30 MED ORDER — TRAMADOL HCL 50 MG PO TABS: 50.0000 mg | ORAL_TABLET | Freq: Four times a day (QID) | ORAL | 0 refills | Status: DC | PRN

## 2017-06-30 MED ORDER — HYDROCHLOROTHIAZIDE 12.5 MG PO TABS: 12.5000 mg | ORAL_TABLET | Freq: Every day | ORAL | 0 refills | Status: DC

## 2017-06-30 NOTE — ED Triage Notes (Signed)
Pt c/o pain that started in her back and radiated through her chest with pain in both arms and tingling in left arm. Pt states she was lying down when pain started. C/O SOB, and heart racing today.

## 2017-06-30 NOTE — ED Provider Notes (Signed)
[facility] EMERGENCY DEPARTMENT Provider Note   CSN: [id number] Arrival date & time: 06/30/17  1230   History   Chief Complaint Chief Complaint  Patient presents with  . Chest Pain    HPI Gabriella Murphy is a 37 y.o. female.  HPI   37 year old female presents today with complaints of back and chest pain.  Patient notes that 2 days ago she woke up with pain in her mid thoracic back, worse with movements, worse with palpation.  She also reports pain in her anterior chest again worse with movements or palpations.  Patient denies any significant shortness of breath, though she does have pain with deep inspiration that takes her breath away.  She denies any lower extremity swelling or edema, history DVT or PE.  Patient denies any cardiac history, denies any significant risk factors for cardiac disease other than hypertension and obesity.  Patient denies any fever or cough, she reports she had some tingling in her left fingers this morning that has since resolved.  She denies any lower extremity swelling or edema, she denies any loss of distal sensation strength and motor function, denies any trauma to the chest.  Patient reports she does work at [employer] and is very active.Pt also reports she has consistent hypertension at all previous health care visits but does not take medication.     History reviewed. No pertinent past medical history.  There are no active problems to display for this patient.   Past Surgical History:  Procedure Laterality Date  . BREAST SURGERY       OB History   None      Home Medications    Prior to Admission medications   Medication Sig Start Date End Date Taking? Authorizing Provider  ibuprofen (ADVIL,MOTRIN) 200 MG tablet Take 400 mg by mouth every 6 (six) hours as needed for pain or fever.    Yes [provider]  hydrochlorothiazide (HYDRODIURIL) 12.5 MG tablet Take 1 tablet (12.5 mg total) by mouth daily. 06/30/17   Zalyn Amend,  Tinnie Gens, PA-C  traMADol (ULTRAM) 50 MG tablet Take 1 tablet (50 mg total) by mouth every 6 (six) hours as needed. 06/30/17   Eyvonne Mechanic, PA-C    Family History Family History  Problem Relation Age of Onset  . Hyperlipidemia Mother   . Diabetes Maternal Grandmother   . Hyperlipidemia Maternal Grandmother   . Heart disease Maternal Grandfather   . Hyperlipidemia Maternal Grandfather   . Stroke Maternal Grandfather   . Diabetes Paternal Grandmother   . Heart disease Paternal Grandmother   . Hyperlipidemia Paternal Grandmother   . Stroke Paternal Grandmother     Social History Social History   Tobacco Use  . Smoking status: Current Every Day Smoker    Packs/day: 0.50    Types: Cigarettes  . Smokeless tobacco: Never Used  Substance Use Topics  . Alcohol use: Yes    Comment: occassional  . Drug use: No     Allergies   Patient has no known allergies.   Review of Systems Review of Systems  All other systems reviewed and are negative.    Physical Exam Updated Vital Signs BP (!) 147/105   Pulse 77   Temp 98.4 F (36.9 C) (Oral)   Resp 17   Ht 5\' 5"  (1.651 m)   Wt 99.8 kg (220 lb)   LMP 06/30/2017   SpO2 100%   BMI 36.61 kg/m   Physical Exam  Constitutional: She is oriented to person, place,  and time. She appears well-developed and well-nourished.  HENT:  Head: Normocephalic and atraumatic.  Eyes: Pupils are equal, round, and reactive to light. Conjunctivae are normal. Right eye exhibits no discharge. Left eye exhibits no discharge. No scleral icterus.  Neck: Normal range of motion. No JVD present. No tracheal deviation present.  Cardiovascular: Normal rate, regular rhythm, normal heart sounds and intact distal pulses. Exam reveals no gallop and no friction rub.  No murmur heard. Pulmonary/Chest: Effort normal. No stridor. No respiratory distress. She has no wheezes. She has no rales. She exhibits tenderness.  Tenderness palpation of the sternum and anterior  chest wall no rashes or signs of trauma-expansion normal  Musculoskeletal:  Tenderness palpation of mid upper thoracic musculature, no rashes, no warmth to touch no bony abnormalities-birthmark noted to left posterior shoulder  Draining abscess left axilla proximally 1 cm no surrounding redness, right axilla 1 cm of induration no drainage no surrounding redness  Neurological: She is alert and oriented to person, place, and time. Coordination normal.  Psychiatric: She has a normal mood and affect. Her behavior is normal. Judgment and thought content normal.  Nursing note and vitals reviewed.   ED Treatments / Results  Labs (all labs ordered are listed, but only abnormal results are displayed) Labs Reviewed  CBC - Abnormal; Notable for the following components:      Result Value   Hemoglobin 11.9 (*)    All other components within normal limits  BASIC METABOLIC PANEL  I-STAT TROPONIN, ED  I-STAT BETA HCG BLOOD, ED ([facility], [facility], [facility] ONLY)    EKG None   ED ECG REPORT   Date: 06/30/2017  Rate: 85  Rhythm: normal sinus rhythm  QRS Axis: normal  Intervals: normal  ST/T Wave abnormalities: normal  Conduction Disutrbances:none  Narrative Interpretation:   Old EKG Reviewed: none available  I have personally reviewed the EKG tracing and agree with the computerized printout as noted.  Radiology Dg Chest 2 View  Result Date: 06/30/2017 CLINICAL DATA:  Two days of chest pain.  Current smoker. EXAM: CHEST - 2 VIEW COMPARISON:  None in PACs FINDINGS: The lungs are adequately inflated. There is no focal infiltrate. There is no pleural effusion. The heart and pulmonary vascularity are normal. The mediastinum is normal in width. The trachea is midline. The bony thorax exhibits no acute abnormality. IMPRESSION: There is no pneumonia nor other acute cardiopulmonary abnormality. Electronically Signed   By: David  SwazilandJordan M.D.   On: 06/30/2017 13:31    Procedures Procedures (including critical care  time)  Medications Ordered in ED Medications - No data to display   Initial Impression / Assessment and Plan / ED Course  I have reviewed the triage vital signs and the nursing notes.  Pertinent labs & imaging results that were available during my care of the patient were reviewed by me and considered in my medical decision making (see chart for details).     Final Clinical Impressions(s) / ED Diagnoses   Final diagnoses:  Chest wall pain  Musculoskeletal pain  Abscess  Hypertension, unspecified type  Nonspecific chest pain    Labs: I-STAT troponin, i-STAT beta-hCG, BMP, CBC,   Imaging: ED EKG  Consults:  Therapeutics:  Discharge Meds: Ultram, HCTZ  Assessment/Plan: 37 year old female presents today with likely musculoskeletal back pain.  Patient has reproducible pain worse with movement, no signs of PE, ACS or dissection.  She has reassuring workup with no significant laboratory or imaging abnormalities.  Patient also having abscesses draining no  signs of surrounding infection no need for antibiotics at this time.  Patient also here with hypertension, chart review shows that she is consistently hypertension at previous visits, she has no signs of endorgan damage, she will be started on low-dose HCTZ.  Patient will follow-up with primary care for continued management.  Instructions given, strict return precautions given.  Patient verbalized understanding and agreement to today's plan.     ED Discharge Orders        Ordered    hydrochlorothiazide (HYDRODIURIL) 12.5 MG tablet  Daily     06/30/17 1701    traMADol (ULTRAM) 50 MG tablet  Every 6 hours PRN     06/30/17 1704       Eyvonne Mechanic, PA-C 06/30/17 1706    Mancel Bale, MD 07/02/17 2027

## 2017-06-30 NOTE — Discharge Instructions (Addendum)
Please read attached information. If you experience any new or worsening signs or symptoms please return to the emergency room for evaluation. Please follow-up with your primary care provider or specialist as discussed. Please use medication prescribed only as directed and discontinue taking if you have any concerning signs or symptoms.   °

## 2017-06-30 NOTE — ED Triage Notes (Signed)
Pt reports she has abscesses under each arm that have been draining for about 3 days, reports hx of same, denies any fever.

## 2017-09-18 ENCOUNTER — Emergency Department (HOSPITAL_COMMUNITY)
Admission: EM | Admit: 2017-09-18 | Discharge: 2017-09-18 | Disposition: A | Discharge status: Home / Self Care | Payer: BLUE CROSS/BLUE SHIELD | Attending: Emergency Medicine | Admitting: Emergency Medicine

## 2017-09-18 ENCOUNTER — Encounter (HOSPITAL_COMMUNITY): Payer: Self-pay | Admitting: Emergency Medicine

## 2017-09-18 ENCOUNTER — Emergency Department (HOSPITAL_COMMUNITY): Payer: BLUE CROSS/BLUE SHIELD

## 2017-09-18 ENCOUNTER — Other Ambulatory Visit: Payer: Self-pay

## 2017-09-18 DIAGNOSIS — M25561 Pain in right knee: Secondary | ICD-10-CM | POA: Insufficient documentation

## 2017-09-18 DIAGNOSIS — Z79899 Other long term (current) drug therapy: Secondary | ICD-10-CM | POA: Diagnosis not present

## 2017-09-18 DIAGNOSIS — M25571 Pain in right ankle and joints of right foot: Secondary | ICD-10-CM | POA: Insufficient documentation

## 2017-09-18 DIAGNOSIS — I10 Essential (primary) hypertension: Secondary | ICD-10-CM | POA: Diagnosis not present

## 2017-09-18 DIAGNOSIS — F17210 Nicotine dependence, cigarettes, uncomplicated: Secondary | ICD-10-CM | POA: Diagnosis not present

## 2017-09-18 MED ORDER — TRAMADOL HCL 50 MG PO TABS
100.0000 mg | ORAL_TABLET | Freq: Once | ORAL | Status: AC
  Administered 2017-09-18: 100 mg via ORAL
  Filled 2017-09-18: qty 2

## 2017-09-18 MED ORDER — IBUPROFEN 800 MG PO TABS
800.0000 mg | ORAL_TABLET | Freq: Once | ORAL | Status: AC
  Administered 2017-09-18: 800 mg via ORAL
  Filled 2017-09-18: qty 1

## 2017-09-18 MED ORDER — ONDANSETRON HCL 4 MG PO TABS
4.0000 mg | ORAL_TABLET | Freq: Once | ORAL | Status: AC
  Administered 2017-09-18: 4 mg via ORAL
  Filled 2017-09-18: qty 1

## 2017-09-18 MED ORDER — TRAMADOL HCL 50 MG PO TABS: 50.0000 mg | ORAL_TABLET | Freq: Four times a day (QID) | ORAL | 0 refills | Status: DC | PRN

## 2017-09-18 MED ORDER — HYDROCHLOROTHIAZIDE 12.5 MG PO TABS: 12.5000 mg | ORAL_TABLET | Freq: Every day | ORAL | 0 refills | Status: DC

## 2017-09-18 MED ORDER — DEXAMETHASONE 4 MG PO TABS: 4.0000 mg | ORAL_TABLET | Freq: Two times a day (BID) | ORAL | 0 refills | Status: DC

## 2017-09-18 NOTE — Discharge Instructions (Addendum)
The x-rays of your knee and ankle are negative for fracture or dislocation.  There is no evidence of a joint effusion present.  I suspect that you may have some arthritic changes that may be aggravated by general wear and tear, as well as may be the weather changes recently.  Your blood pressure is significantly elevated.  Please see your primary physician concerning this, or visit the [facility] for assistance with your blood pressure.  Please use HydroDIURIL daily with food.  Please increase bananas, and food with potassium since she will be on a diuretic related medication.  Please use Decadron 2 times daily with food until all taken.  May use Ultram every 6 hours for pain not relieved by extra strength Tylenol.  Please use your knee sleeve, and ankle stirrup splint to help improve your pain and discomfort when up and about.

## 2017-09-18 NOTE — ED Provider Notes (Signed)
[facility]-EMERGENCY DEPT Provider Note   CSN: [id number] Arrival date & time: 09/18/17  1407     History   Chief Complaint Chief Complaint  Patient presents with  . Knee Pain    HPI Gabriella Murphy is a 37 y.o. female.  HPI  History reviewed. No pertinent past medical history.  There are no active problems to display for this patient.   Past Surgical History:  Procedure Laterality Date  . BREAST SURGERY       OB History   None      Home Medications    Prior to Admission medications   Medication Sig Start Date End Date Taking? Authorizing Provider  hydrochlorothiazide (HYDRODIURIL) 12.5 MG tablet Take 1 tablet (12.5 mg total) by mouth daily. 06/30/17   Hedges, Tinnie GensJeffrey, PA-C  ibuprofen (ADVIL,MOTRIN) 200 MG tablet Take 400 mg by mouth every 6 (six) hours as needed for pain or fever.     [provider]  traMADol (ULTRAM) 50 MG tablet Take 1 tablet (50 mg total) by mouth every 6 (six) hours as needed. 06/30/17   Eyvonne MechanicHedges, Jeffrey, PA-C    Family History Family History  Problem Relation Age of Onset  . Hyperlipidemia Mother   . Diabetes Maternal Grandmother   . Hyperlipidemia Maternal Grandmother   . Heart disease Maternal Grandfather   . Hyperlipidemia Maternal Grandfather   . Stroke Maternal Grandfather   . Diabetes Paternal Grandmother   . Heart disease Paternal Grandmother   . Hyperlipidemia Paternal Grandmother   . Stroke Paternal Grandmother     Social History Social History   Tobacco Use  . Smoking status: Current Every Day Smoker    Packs/day: 0.50    Types: Cigarettes  . Smokeless tobacco: Never Used  Substance Use Topics  . Alcohol use: Yes    Comment: occassional  . Drug use: No     Allergies   Patient has no known allergies.   Review of Systems Review of Systems  Constitutional: Negative for activity change.       All ROS Neg except as noted in HPI  HENT: Negative for nosebleeds.   Eyes: Negative for  photophobia and discharge.  Respiratory: Negative for cough, shortness of breath and wheezing.   Cardiovascular: Negative for chest pain and palpitations.  Gastrointestinal: Negative for abdominal pain and blood in stool.  Genitourinary: Negative for dysuria, frequency and hematuria.  Musculoskeletal: Positive for arthralgias. Negative for back pain and neck pain.  Skin: Negative.   Neurological: Negative for dizziness, seizures and speech difficulty.  Psychiatric/Behavioral: Negative for confusion and hallucinations.     Physical Exam Updated Vital Signs BP (!) 182/106 (BP Location: Left Arm)   Pulse (!) 101   Temp 98.7 F (37.1 C) (Oral)   Resp 20   SpO2 99%   Physical Exam  Constitutional: She is oriented to person, place, and time. She appears well-developed and well-nourished.  Non-toxic appearance.  HENT:  Head: Normocephalic.  Right Ear: Tympanic membrane and external ear normal.  Left Ear: Tympanic membrane and external ear normal.  Eyes: Pupils are equal, round, and reactive to light. EOM and lids are normal.  Neck: Normal range of motion. Neck supple. Carotid bruit is not present.  Cardiovascular: Normal rate, regular rhythm, normal heart sounds, intact distal pulses and normal pulses.  Pulmonary/Chest: Breath sounds normal. No respiratory distress.  Abdominal: Soft. Bowel sounds are normal. There is no tenderness. There is no guarding.  Musculoskeletal: Normal range of motion.  Left knee: Tenderness found.       Left ankle: Tenderness. Lateral malleolus and medial malleolus tenderness found.  Posterior knee pain. Pain with attempted flex and extending the knee.  Pain with flex and attempted extending the left ankle.  No hot joints.  Lymphadenopathy:       Head (right side): No submandibular adenopathy present.       Head (left side): No submandibular adenopathy present.    She has no cervical adenopathy.  Neurological: She is alert and oriented to person,  place, and time. She has normal strength. No cranial nerve deficit or sensory deficit.  Skin: Skin is warm and dry.  Psychiatric: She has a normal mood and affect. Her speech is normal.  Nursing note and vitals reviewed.    ED Treatments / Results  Labs (all labs ordered are listed, but only abnormal results are displayed) Labs Reviewed - No data to display  EKG None  Radiology No results found.  Procedures Procedures (including critical care time)  Medications Ordered in ED Medications - No data to display   Initial Impression / Assessment and Plan / ED Course  I have reviewed the triage vital signs and the nursing notes.  Pertinent labs & imaging results that were available during my care of the patient were reviewed by me and considered in my medical decision making (see chart for details).       Final Clinical Impressions(s) / ED Diagnoses MDM  Blood pressure is elevated patient states that she has not had blood pressure medications for over a month.. The remainder of vital signs within normal limits.  Pulse oximetry is 100% on room air.  There is pain with attempted flexion and extension of the right knee.  There is mild crepitus noted, but no hot joint appreciated.  The x-ray of the left knee is negative for fracture, dislocation, or joint effusion.  I suspect that the pain is probably arthritic related, aggravated by general wear and tear.  Patient has pain of the medial and lateral malleolus of the left ankle.  The x-ray is negative for fracture, dislocation, or joint effusion.  I again suspect arthritic changes, aggravated by general wear and tear.  The patient will be fitted with a knee sleeve, as well as an ankle stirrup splint.  Patient will use anti-inflammatory pain medication.  Patient will be referred to orthopedics for additional evaluation if not improving.  Patient is in agreement with this plan.   Final diagnoses:  Acute pain of right knee  Acute right  ankle pain  Essential hypertension    ED Discharge Orders        Ordered    traMADol (ULTRAM) 50 MG tablet  Every 6 hours PRN     09/18/17 1647    dexamethasone (DECADRON) 4 MG tablet  2 times daily with meals     09/18/17 1648    hydrochlorothiazide (HYDRODIURIL) 12.5 MG tablet  Daily     09/18/17 1650       Ivery Quale, PA-C 09/18/17 1657    Arby Barrette, MD 09/20/17 [id number]

## 2017-09-18 NOTE — ED Triage Notes (Addendum)
Left knee pain noted throughout the night; now severe pain/unable to put weight on left leg; denies injury; denies past medical hx.

## 2017-09-18 NOTE — ED Notes (Signed)
Ortho tech called for ASO ankle and knee sleeve placement

## 2017-10-27 ENCOUNTER — Emergency Department (HOSPITAL_COMMUNITY)
Admission: EM | Admit: 2017-10-27 | Discharge: 2017-10-27 | Disposition: A | Discharge status: Home / Self Care | Payer: BLUE CROSS/BLUE SHIELD | Attending: Emergency Medicine | Admitting: Emergency Medicine

## 2017-10-27 ENCOUNTER — Other Ambulatory Visit: Payer: Self-pay

## 2017-10-27 ENCOUNTER — Encounter (HOSPITAL_COMMUNITY): Payer: Self-pay

## 2017-10-27 ENCOUNTER — Emergency Department (HOSPITAL_COMMUNITY): Payer: BLUE CROSS/BLUE SHIELD

## 2017-10-27 DIAGNOSIS — M25572 Pain in left ankle and joints of left foot: Secondary | ICD-10-CM

## 2017-10-27 DIAGNOSIS — S93402A Sprain of unspecified ligament of left ankle, initial encounter: Secondary | ICD-10-CM | POA: Diagnosis not present

## 2017-10-27 DIAGNOSIS — Z79899 Other long term (current) drug therapy: Secondary | ICD-10-CM | POA: Diagnosis not present

## 2017-10-27 DIAGNOSIS — W19XXXA Unspecified fall, initial encounter: Secondary | ICD-10-CM

## 2017-10-27 DIAGNOSIS — M79602 Pain in left arm: Secondary | ICD-10-CM | POA: Insufficient documentation

## 2017-10-27 DIAGNOSIS — Y999 Unspecified external cause status: Secondary | ICD-10-CM | POA: Diagnosis not present

## 2017-10-27 DIAGNOSIS — S99912A Unspecified injury of left ankle, initial encounter: Secondary | ICD-10-CM | POA: Diagnosis present

## 2017-10-27 DIAGNOSIS — F17210 Nicotine dependence, cigarettes, uncomplicated: Secondary | ICD-10-CM | POA: Diagnosis not present

## 2017-10-27 DIAGNOSIS — Y9301 Activity, walking, marching and hiking: Secondary | ICD-10-CM | POA: Insufficient documentation

## 2017-10-27 DIAGNOSIS — W108XXA Fall (on) (from) other stairs and steps, initial encounter: Secondary | ICD-10-CM | POA: Diagnosis not present

## 2017-10-27 DIAGNOSIS — Y929 Unspecified place or not applicable: Secondary | ICD-10-CM | POA: Insufficient documentation

## 2017-10-27 MED ORDER — KETOROLAC TROMETHAMINE 30 MG/ML IJ SOLN
15.0000 mg | Freq: Once | INTRAMUSCULAR | Status: AC
  Administered 2017-10-27: 15 mg via INTRAMUSCULAR
  Filled 2017-10-27: qty 1

## 2017-10-27 MED ORDER — IBUPROFEN 400 MG PO TABS: 400.0000 mg | ORAL_TABLET | Freq: Three times a day (TID) | ORAL | 0 refills | Status: AC

## 2017-10-27 MED ORDER — TRAMADOL HCL 50 MG PO TABS: 50.0000 mg | ORAL_TABLET | Freq: Four times a day (QID) | ORAL | 0 refills | Status: DC | PRN

## 2017-10-27 NOTE — ED Provider Notes (Signed)
[facility]-EMERGENCY DEPT Provider Note   CSN: [id number] Arrival date & time: 10/27/17  4098     History   Chief Complaint Chief Complaint  Patient presents with  . Fall    HPI Gabriella Murphy is a 37 y.o. female.  HPI Presents with pain in multiple areas after a fall. Patient was in her usual state of health when she stumbled going down the stairs, and fell, and twisted her ankle. Since that time she has been ambulatory, though with difficulty, using a cane, to keep her left ankle immobilized. She has pain in her left upper extremity, and left ankle, most notably in the lower extremity. Pain is sore, severe, not improved with OTC medication. No loss of consciousness, no midline neck pain, no numbness, weakness, tingling anywhere aside from the left distal foot.  History reviewed. No pertinent past medical history.  There are no active problems to display for this patient.   Past Surgical History:  Procedure Laterality Date  . BREAST SURGERY       OB History   None      Home Medications    Prior to Admission medications   Medication Sig Start Date End Date Taking? Authorizing Provider  dexamethasone (DECADRON) 4 MG tablet Take 1 tablet (4 mg total) by mouth 2 (two) times daily with a meal. 09/18/17  Yes Ivery Quale, PA-C  hydrochlorothiazide (HYDRODIURIL) 12.5 MG tablet Take 1 tablet (12.5 mg total) by mouth daily. 09/18/17  Yes Ivery Quale, PA-C  traMADol (ULTRAM) 50 MG tablet Take 1 tablet (50 mg total) by mouth every 6 (six) hours as needed. 09/18/17  Yes Ivery Quale, PA-C    Family History Family History  Problem Relation Age of Onset  . Hyperlipidemia Mother   . Diabetes Maternal Grandmother   . Hyperlipidemia Maternal Grandmother   . Heart disease Maternal Grandfather   . Hyperlipidemia Maternal Grandfather   . Stroke Maternal Grandfather   . Diabetes Paternal Grandmother   . Heart disease Paternal Grandmother   .  Hyperlipidemia Paternal Grandmother   . Stroke Paternal Grandmother     Social History Social History   Tobacco Use  . Smoking status: Current Every Day Smoker    Packs/day: 0.50    Types: Cigarettes  . Smokeless tobacco: Never Used  Substance Use Topics  . Alcohol use: Yes    Comment: occassional  . Drug use: No     Allergies   Patient has no known allergies.   Review of Systems Review of Systems  Constitutional:       Per HPI, otherwise negative  HENT:       Per HPI, otherwise negative  Respiratory:       Per HPI, otherwise negative  Cardiovascular:       Per HPI, otherwise negative  Gastrointestinal: Negative for vomiting.  Endocrine:       Negative aside from HPI  Genitourinary:       Neg aside from HPI   Musculoskeletal:       Per HPI, otherwise negative  Skin: Negative.   Neurological: Negative for syncope.     Physical Exam Updated Vital Signs BP (!) 152/103   Pulse 84   Temp 98.7 F (37.1 C) (Oral)   Resp 15   Wt 104.3 kg (230 lb)   SpO2 100%   BMI 38.27 kg/m   Physical Exam  Constitutional: She is oriented to person, place, and time. She appears well-developed and well-nourished. No distress.  HENT:  Head: Normocephalic and atraumatic.  Eyes: Conjunctivae and EOM are normal.  Neck: Muscular tenderness present. No spinous process tenderness present. No neck rigidity. No edema, no erythema and normal range of motion present.    Cardiovascular: Normal rate and regular rhythm.  Pulmonary/Chest: Effort normal and breath sounds normal. No stridor. No respiratory distress.  Abdominal: She exhibits no distension.  Musculoskeletal: She exhibits no edema.       Left shoulder: She exhibits tenderness, bony tenderness and pain. She exhibits normal range of motion, no swelling, no effusion, no crepitus, no deformity, no laceration and no spasm.       Left elbow: She exhibits normal range of motion, no swelling, no effusion, no deformity and no  laceration. Tenderness found.       Left wrist: Normal.       Left knee: Normal.       Left ankle: She exhibits decreased range of motion and swelling. She exhibits normal pulse. Tenderness. Lateral malleolus and medial malleolus tenderness found.       Feet:  Neurological: She is alert and oriented to person, place, and time. No cranial nerve deficit.  Skin: Skin is warm and dry.  Psychiatric: She has a normal mood and affect.  Nursing note and vitals reviewed.    ED Treatments / Results  Labs (all labs ordered are listed, but only abnormal results are displayed) Labs Reviewed - No data to display  EKG None  Radiology Dg Elbow Complete Left  Result Date: 10/27/2017 CLINICAL DATA:  37 year old female fell. Left elbow pain. Initial encounter. EXAM: LEFT ELBOW - COMPLETE 3+ VIEW COMPARISON:  None. FINDINGS: There is no evidence of fracture, dislocation, or joint effusion. There is no evidence of arthropathy or other focal bone abnormality. Soft tissues are unremarkable. IMPRESSION: Negative. Electronically Signed   By: Lacy Duverney M.D.   On: 10/27/2017 10:19   Dg Ankle Complete Left  Result Date: 10/27/2017 CLINICAL DATA:  Left ankle pain due to a twisting injury suffered in a fall today. Initial encounter. EXAM: LEFT ANKLE COMPLETE - 3+ VIEW COMPARISON:  Plain films left ankle 09/18/2017. FINDINGS: There is no evidence of fracture, dislocation, or joint effusion. There is no evidence of arthropathy or other focal bone abnormality. Soft tissues are unremarkable. IMPRESSION: Negative exam. Electronically Signed   By: Drusilla Kanner M.D.   On: 10/27/2017 10:10   Dg Shoulder Left  Result Date: 10/27/2017 CLINICAL DATA:  37 year old female post fall. Left shoulder pain. Initial encounter. EXAM: LEFT SHOULDER - 2+ VIEW COMPARISON:  None. FINDINGS: Two view examination left shoulder without evidence of fracture or dislocation. Mild curvature of the anterior aspect acromion. No abnormal  soft tissue calcifications. Visualized lungs clear. IMPRESSION: Two view examination of the left shoulder without fracture or dislocation noted. Electronically Signed   By: Lacy Duverney M.D.   On: 10/27/2017 10:12    Procedures Procedures (including critical care time)  Medications Ordered in ED Medications  ketorolac (TORADOL) 30 MG/ML injection 15 mg (15 mg Intramuscular Given 10/27/17 1012)     Initial Impression / Assessment and Plan / ED Course  I have reviewed the triage vital signs and the nursing notes.  Pertinent labs & imaging results that were available during my care of the patient were reviewed by me and considered in my medical decision making (see chart for details).     11:11 AM On repeat exam patient is awake and alert, in no distress, moving her upper extremities spontaneously, continued pain  in the left lower extremity. We discussed all findings, x-rays reassuring, no evidence for fracture. Patient affirms that she has been wearing a ankle immobilization sleeve, due to pain that began about 1 month ago, unclear precipitant. With today's fall, increasing pain, increasing suspicion for soft tissue injury, the patient will have a cam walker applied, follow-up with orthopedics. With otherwise reassuring findings, no evidence for neurologic dysfunction, patient is appropriate for discharge with outpatient follow-up.  Final Clinical Impressions(s) / ED Diagnoses  Fall, initial encounter Left ankle sprain, initial encounter   Gerhard MunchLockwood, Allanah Mcfarland, MD 10/27/17 1112

## 2017-10-27 NOTE — ED Notes (Signed)
Called ortho tech for cam walker.  

## 2017-10-27 NOTE — Discharge Instructions (Signed)
Please be sure to schedule follow-up appointment with her orthopedic colleagues for evaluation of your ongoing left ankle pain.  Return here for concerning changes in your condition.

## 2017-10-27 NOTE — ED Notes (Signed)
Ice applied to left ankle.

## 2017-10-27 NOTE — ED Triage Notes (Signed)
Pt arrives via POV from home. Pt reports that she tripped, falling down about 10 steps. Pt reports pain to left ankle, left wrist, left shoulder. Pt denies LOC.

## 2017-11-15 ENCOUNTER — Ambulatory Visit: Payer: BLUE CROSS/BLUE SHIELD | Admitting: Family Medicine

## 2017-11-24 ENCOUNTER — Other Ambulatory Visit: Payer: Self-pay

## 2017-11-24 ENCOUNTER — Ambulatory Visit (INDEPENDENT_AMBULATORY_CARE_PROVIDER_SITE_OTHER): Payer: BLUE CROSS/BLUE SHIELD | Admitting: Family Medicine

## 2017-11-24 ENCOUNTER — Encounter: Payer: Self-pay | Admitting: Family Medicine

## 2017-11-24 VITALS — BP 145/92 | HR 73 | Temp 99.1°F | Resp 17 | Ht 65.0 in | Wt 234.6 lb

## 2017-11-24 DIAGNOSIS — I10 Essential (primary) hypertension: Secondary | ICD-10-CM | POA: Diagnosis not present

## 2017-11-24 DIAGNOSIS — M05772 Rheumatoid arthritis with rheumatoid factor of left ankle and foot without organ or systems involvement: Secondary | ICD-10-CM | POA: Diagnosis not present

## 2017-11-24 DIAGNOSIS — Z6839 Body mass index (BMI) 39.0-39.9, adult: Secondary | ICD-10-CM | POA: Diagnosis not present

## 2017-11-24 MED ORDER — HYDROCHLOROTHIAZIDE 12.5 MG PO TABS: 12.5000 mg | ORAL_TABLET | Freq: Every day | ORAL | 6 refills | Status: DC

## 2017-11-24 NOTE — Progress Notes (Signed)
Chief Complaint  Patient presents with  . New Patient (Initial Visit)    establish care.  Concerns about blood pressure    HPI  Hypertension: Patient here for follow-up of elevated blood pressure.  She reports that she was given HCTZ for her high blood pressure in the ER She reports that she took the medication but has been out for a month She has been checking her bp at home and the numbers have been high with sbp 150-160s G1P1001 no PIH or Preeclampsia She smokes about 4 cigarettes a day   She takes alleve (NSAIDS)recently for her left ankle pain    She was diagnosed with RA of the left ankle She also fell twice going down the stairs at her home She denies being pushed    No past medical history on file.  Current Outpatient Medications  Medication Sig Dispense Refill  . hydrochlorothiazide (HYDRODIURIL) 12.5 MG tablet Take 1 tablet (12.5 mg total) by mouth daily. 30 tablet 6  . HYDROcodone-acetaminophen (NORCO/VICODIN) 5-325 MG tablet Take 1 tablet by mouth every 8 (eight) hours.    . predniSONE (DELTASONE) 10 MG tablet Take 10 mg by mouth 5 days.     No current facility-administered medications for this visit.     Allergies: No Known Allergies  Past Surgical History:  Procedure Laterality Date  . BREAST SURGERY      Social History   Socioeconomic History  . Marital status: Single    Spouse name: Not on file  . Number of children: Not on file  . Years of education: Not on file  . Highest education level: Not on file  Occupational History  . Not on file  Social Needs  . Financial resource strain: Not on file  . Food insecurity:    Worry: Not on file    Inability: Not on file  . Transportation needs:    Medical: Not on file    Non-medical: Not on file  Tobacco Use  . Smoking status: Current Every Day Smoker    Packs/day: 0.50    Types: Cigarettes  . Smokeless tobacco: Never Used  Substance and Sexual Activity  . Alcohol use: Yes    Comment:  occassional  . Drug use: No  . Sexual activity: Not on file  Lifestyle  . Physical activity:    Days per week: Not on file    Minutes per session: Not on file  . Stress: Not on file  Relationships  . Social connections:    Talks on phone: Not on file    Gets together: Not on file    Attends religious service: Not on file    Active member of club or organization: Not on file    Attends meetings of clubs or organizations: Not on file    Relationship status: Not on file  Other Topics Concern  . Not on file  Social History Narrative  . Not on file    Family History  Problem Relation Age of Onset  . Hyperlipidemia Mother   . Diabetes Maternal Grandmother   . Hyperlipidemia Maternal Grandmother   . Heart disease Maternal Grandfather   . Hyperlipidemia Maternal Grandfather   . Stroke Maternal Grandfather   . Diabetes Paternal Grandmother   . Heart disease Paternal Grandmother   . Hyperlipidemia Paternal Grandmother   . Stroke Paternal Grandmother      ROS Review of Systems See HPI Constitution: No fevers or chills No malaise No diaphoresis Skin: No rash or itching Eyes:  no blurry vision, no double vision GU: no dysuria or hematuria Neuro: no dizziness or headaches * all others reviewed and negative   Objective: Vitals:   11/24/17 1544  BP: (!) 150/95  Pulse: 97  Resp: 17  Temp: 99.1 F (37.3 C)  TempSrc: Oral  SpO2: 99%  Weight: 234 lb 9.6 oz (106.4 kg)  Height: 5\' 5"  (1.651 m)  Body mass index is 39.04 kg/m.   Physical Exam  Constitutional: She is oriented to person, place, and time. She appears well-developed and well-nourished.  HENT:  Head: Normocephalic and atraumatic.  Eyes: Conjunctivae and EOM are normal.  Neck: Normal range of motion. Neck supple.  Cardiovascular: Normal rate, regular rhythm and normal heart sounds.  No murmur heard. Pulmonary/Chest: Effort normal and breath sounds normal. No stridor. No respiratory distress. She has no  wheezes.  Neurological: She is alert and oriented to person, place, and time.  Psychiatric: She has a normal mood and affect. Her behavior is normal. Judgment and thought content normal.    Assessment and Plan Nicola GirtSamilia was seen today for new patient (initial visit).  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     Avereigh Spainhower A Jonerik Sliker

## 2017-11-24 NOTE — Patient Instructions (Addendum)
   If you have lab work done today you will be contacted with your lab results within the next 2 weeks.  If you have not heard from us then please contact us. The fastest way to get your results is to register for My Chart.   IF you received an x-ray today, you will receive an invoice from [facility]. Please contact [facility] at [phone] with questions or concerns regarding your invoice.   IF you received labwork today, you will receive an invoice from [facility]. Please contact [facility] at [phone] with questions or concerns regarding your invoice.   Our billing staff will not be able to assist you with questions regarding bills from these companies.  You will be contacted with the lab results as soon as they are available. The fastest way to get your results is to activate your My Chart account. Instructions are located on the last page of this paperwork. If you have not heard from us regarding the results in 2 weeks, please contact this office.     DASH Eating Plan DASH stands for "Dietary Approaches to Stop Hypertension." The DASH eating plan is a healthy eating plan that has been shown to reduce high blood pressure (hypertension). It may also reduce your risk for type 2 diabetes, heart disease, and stroke. The DASH eating plan may also help with weight loss. What are tips for following this plan? General guidelines  Avoid eating more than 2,300 mg (milligrams) of salt (sodium) a day. If you have hypertension, you may need to reduce your sodium intake to 1,500 mg a day.  Limit alcohol intake to no more than 1 drink a day for nonpregnant women and 2 drinks a day for men. One drink equals 12 oz of beer, 5 oz of wine, or 1 oz of hard liquor.  Work with your health care provider to maintain a healthy body weight or to lose weight. Ask what an ideal weight is for you.  Get at least 30 minutes of exercise that causes your heart to beat faster (aerobic exercise)  most days of the week. Activities may include walking, swimming, or biking.  Work with your health care provider or diet and nutrition specialist (dietitian) to adjust your eating plan to your individual calorie needs. Reading food labels  Check food labels for the amount of sodium per serving. Choose foods with less than 5 percent of the Daily Value of sodium. Generally, foods with less than 300 mg of sodium per serving fit into this eating plan.  To find whole grains, look for the word "whole" as the first word in the ingredient list. Shopping  Buy products labeled as "low-sodium" or "no salt added."  Buy fresh foods. Avoid canned foods and premade or frozen meals. Cooking  Avoid adding salt when cooking. Use salt-free seasonings or herbs instead of table salt or sea salt. Check with your health care provider or pharmacist before using salt substitutes.  Do not fry foods. Cook foods using healthy methods such as baking, boiling, grilling, and broiling instead.  Cook with heart-healthy oils, such as olive, canola, soybean, or sunflower oil. Meal planning   Eat a balanced diet that includes: ? 5 or more servings of fruits and vegetables each day. At each meal, try to fill half of your plate with fruits and vegetables. ? Up to 6-8 servings of whole grains each day. ? Less than 6 oz of lean meat, poultry, or fish each day. A 3-oz serving of   meat is about the same size as a deck of cards. One egg equals 1 oz. ? 2 servings of low-fat dairy each day. ? A serving of nuts, seeds, or beans 5 times each week. ? Heart-healthy fats. Healthy fats called Omega-3 fatty acids are found in foods such as flaxseeds and coldwater fish, like sardines, salmon, and mackerel.  Limit how much you eat of the following: ? Canned or prepackaged foods. ? Food that is high in trans fat, such as fried foods. ? Food that is high in saturated fat, such as fatty meat. ? Sweets, desserts, sugary drinks, and other  foods with added sugar. ? Full-fat dairy products.  Do not salt foods before eating.  Try to eat at least 2 vegetarian meals each week.  Eat more home-cooked food and less restaurant, buffet, and fast food.  When eating at a restaurant, ask that your food be prepared with less salt or no salt, if possible. What foods are recommended? The items listed may not be a complete list. Talk with your dietitian about what dietary choices are best for you. Grains Whole-grain or whole-wheat bread. Whole-grain or whole-wheat pasta. Brown rice. Oatmeal. Quinoa. Bulgur. Whole-grain and low-sodium cereals. Pita bread. Low-fat, low-sodium crackers. Whole-wheat flour tortillas. Vegetables Fresh or frozen vegetables (raw, steamed, roasted, or grilled). Low-sodium or reduced-sodium tomato and vegetable juice. Low-sodium or reduced-sodium tomato sauce and tomato paste. Low-sodium or reduced-sodium canned vegetables. Fruits All fresh, dried, or frozen fruit. Canned fruit in natural juice (without added sugar). Meat and other protein foods Skinless chicken or turkey. Ground chicken or turkey. Pork with fat trimmed off. Fish and seafood. Egg whites. Dried beans, peas, or lentils. Unsalted nuts, nut butters, and seeds. Unsalted canned beans. Lean cuts of beef with fat trimmed off. Low-sodium, lean deli meat. Dairy Low-fat (1%) or fat-free (skim) milk. Fat-free, low-fat, or reduced-fat cheeses. Nonfat, low-sodium ricotta or cottage cheese. Low-fat or nonfat yogurt. Low-fat, low-sodium cheese. Fats and oils Soft margarine without trans fats. Vegetable oil. Low-fat, reduced-fat, or light mayonnaise and salad dressings (reduced-sodium). Canola, safflower, olive, soybean, and sunflower oils. Avocado. Seasoning and other foods Herbs. Spices. Seasoning mixes without salt. Unsalted popcorn and pretzels. Fat-free sweets. What foods are not recommended? The items listed may not be a complete list. Talk with your dietitian  about what dietary choices are best for you. Grains Baked goods made with fat, such as croissants, muffins, or some breads. Dry pasta or rice meal packs. Vegetables Creamed or fried vegetables. Vegetables in a cheese sauce. Regular canned vegetables (not low-sodium or reduced-sodium). Regular canned tomato sauce and paste (not low-sodium or reduced-sodium). Regular tomato and vegetable juice (not low-sodium or reduced-sodium). Pickles. Olives. Fruits Canned fruit in a light or heavy syrup. Fried fruit. Fruit in cream or butter sauce. Meat and other protein foods Fatty cuts of meat. Ribs. Fried meat. Bacon. Sausage. Bologna and other processed lunch meats. Salami. Fatback. Hotdogs. Bratwurst. Salted nuts and seeds. Canned beans with added salt. Canned or smoked fish. Whole eggs or egg yolks. Chicken or turkey with skin. Dairy Whole or 2% milk, cream, and half-and-half. Whole or full-fat cream cheese. Whole-fat or sweetened yogurt. Full-fat cheese. Nondairy creamers. Whipped toppings. Processed cheese and cheese spreads. Fats and oils Butter. Stick margarine. Lard. Shortening. Ghee. Bacon fat. Tropical oils, such as coconut, palm kernel, or palm oil. Seasoning and other foods Salted popcorn and pretzels. Onion salt, garlic salt, seasoned salt, table salt, and sea salt. Worcestershire sauce. Tartar sauce. Barbecue sauce. Teriyaki   sauce. Soy sauce, including reduced-sodium. Steak sauce. Canned and packaged gravies. Fish sauce. Oyster sauce. Cocktail sauce. Horseradish that you find on the shelf. Ketchup. Mustard. Meat flavorings and tenderizers. Bouillon cubes. Hot sauce and Tabasco sauce. Premade or packaged marinades. Premade or packaged taco seasonings. Relishes. Regular salad dressings. Where to find more information:  National Heart, Lung, and Blood Institute: PopSte[employer].iswww.nhlbi.nih.gov  American Heart Association: www.heart.org Summary  The DASH eating plan is a healthy eating plan that has been shown  to reduce high blood pressure (hypertension). It may also reduce your risk for type 2 diabetes, heart disease, and stroke.  With the DASH eating plan, you should limit salt (sodium) intake to 2,300 mg a day. If you have hypertension, you may need to reduce your sodium intake to 1,500 mg a day.  When on the DASH eating plan, aim to eat more fresh fruits and vegetables, whole grains, lean proteins, low-fat dairy, and heart-healthy fats.  Work with your health care provider or diet and nutrition specialist (dietitian) to adjust your eating plan to your individual calorie needs. This information is not intended to replace advice given to you by your health care provider. Make sure you discuss any questions you have with your health care provider. Document Released: 03/07/2011 Document Revised: 03/11/2016 Document Reviewed: 03/11/2016 Elsevier Interactive Patient Education  2018 [employer]  How to Take Your Blood Pressure Blood pressure is a measurement of how strongly your blood is pressing against the walls of your arteries. Arteries are blood vessels that carry blood from your heart throughout your body. Your health care provider takes your blood pressure at each office visit. You can also take your own blood pressure at home with a blood pressure machine. You may need to take your own blood pressure:  To confirm a diagnosis of high blood pressure (hypertension).  To monitor your blood pressure over time.  To make sure your blood pressure medicine is working.  Supplies needed: To take your blood pressure, you will need a blood pressure machine. You can buy a blood pressure machine, or blood pressure monitor, at most drugstores or online. There are several types of home blood pressure monitors. When choosing one, consider the following:  Choose a monitor that has an arm cuff.  Choose a monitor that wraps snugly around your upper arm. You should be able to fit only one finger between your  arm and the cuff.  Do not choose a monitor that measures your blood pressure from your wrist or finger.  Your health care provider can suggest a reliable monitor that will meet your needs. How to prepare To get the most accurate reading, avoid the following for 30 minutes before you check your blood pressure:  Drinking caffeine.  Drinking alcohol.  Eating.  Smoking.  Exercising.  Five minutes before you check your blood pressure:  Empty your bladder.  Sit quietly without talking in a dining chair, rather than in a soft couch or armchair.  How to take your blood pressure To check your blood pressure, follow the instructions in the manual that came with your blood pressure monitor. If you have a digital blood pressure monitor, the instructions may be as follows: 1. Sit up straight. 2. Place your feet on the floor. Do not cross your ankles or legs. 3. Rest your left arm at the level of your heart on a table or desk or on the arm of a chair. 4. Pull up your shirt sleeve. 5. Wrap the blood pressure  cuff around the upper part of your left arm, 1 inch (2.5 cm) above your elbow. It is best to wrap the cuff around bare skin. 6. Fit the cuff snugly around your arm. You should be able to place only one finger between the cuff and your arm. 7. Position the cord inside the groove of your elbow. 8. Press the power button. 9. Sit quietly while the cuff inflates and deflates. 10. Read the digital reading on the monitor screen and write it down (record it). 11. Wait 2-3 minutes, then repeat the steps, starting at step 1.  What does my blood pressure reading mean? A blood pressure reading consists of a higher number over a lower number. Ideally, your blood pressure should be below 120/80. The first ("top") number is called the systolic pressure. It is a measure of the pressure in your arteries as your heart beats. The second ("bottom") number is called the diastolic pressure. It is a measure of  the pressure in your arteries as the heart relaxes. Blood pressure is classified into four stages. The following are the stages for adults who do not have a short-term serious illness or a chronic condition. Systolic pressure and diastolic pressure are measured in a unit called mm Hg.  Goal BP is less than 140/90 You can have prehypertension or hypertension even if only the systolic or only the diastolic number in your reading is higher than normal. Follow these instructions at home:  Check your blood pressure as often as recommended by your health care provider.  Take your monitor to the next appointment with your health care provider to make sure: ? That you are using it correctly. ? That it provides accurate readings.  Be sure you understand what your goal blood pressure numbers are.  Tell your health care provider if you are having any side effects from blood pressure medicine. Contact a health care provider if:  Your blood pressure is consistently high. Get help right away if:  Your systolic blood pressure is higher than 180.  Your diastolic blood pressure is higher than 110. This information is not intended to replace advice given to you by your health care provider. Make sure you discuss any questions you have with your health care provider. Document Released: 08/25/2015 Document Revised: 11/07/2015 Document Reviewed: 08/25/2015 Elsevier Interactive Patient Education  [employer].

## 2017-11-25 LAB — COMPREHENSIVE METABOLIC PANEL
A/G RATIO: 1.2 (ref 1.2–2.2)
ALT: 14 IU/L (ref 0–32)
AST: 15 IU/L (ref 0–40)
Albumin: 4.5 g/dL (ref 3.5–5.5)
Alkaline Phosphatase: 40 IU/L (ref 39–117)
BILIRUBIN TOTAL: 0.3 mg/dL (ref 0.0–1.2)
BUN / CREAT RATIO: 11 (ref 9–23)
BUN: 8 mg/dL (ref 6–20)
CHLORIDE: 101 mmol/L (ref 96–106)
CO2: 21 mmol/L (ref 20–29)
Calcium: 10.1 mg/dL (ref 8.7–10.2)
Creatinine, Ser: 0.74 mg/dL (ref 0.57–1.00)
GFR calc Af Amer: 120 mL/min/{1.73_m2} (ref >59)
GFR calc non Af Amer: 104 mL/min/{1.73_m2} (ref >59)
GLUCOSE: 89 mg/dL (ref 65–99)
Globulin, Total: 3.7 g/dL (ref 1.5–4.5)
POTASSIUM: 4.6 mmol/L (ref 3.5–5.2)
Sodium: 136 mmol/L (ref 134–144)
Total Protein: 8.2 g/dL (ref 6.0–8.5)

## 2017-11-25 LAB — MICROALBUMIN, URINE: MICROALBUM., U, RANDOM: 26 ug/mL

## 2017-12-02 ENCOUNTER — Encounter: Payer: Self-pay | Admitting: Family Medicine

## 2018-01-08 ENCOUNTER — Encounter: Payer: BLUE CROSS/BLUE SHIELD | Admitting: Family Medicine

## 2018-01-14 ENCOUNTER — Encounter: Payer: BLUE CROSS/BLUE SHIELD | Admitting: Family Medicine

## 2018-03-12 ENCOUNTER — Encounter: Payer: BLUE CROSS/BLUE SHIELD | Admitting: Family Medicine

## 2018-04-07 ENCOUNTER — Encounter (HOSPITAL_COMMUNITY): Payer: Self-pay

## 2018-04-07 ENCOUNTER — Emergency Department (HOSPITAL_COMMUNITY)
Admission: EM | Admit: 2018-04-07 | Discharge: 2018-04-07 | Disposition: A | Discharge status: Home / Self Care | Payer: BLUE CROSS/BLUE SHIELD | Attending: Emergency Medicine | Admitting: Emergency Medicine

## 2018-04-07 DIAGNOSIS — L02411 Cutaneous abscess of right axilla: Secondary | ICD-10-CM | POA: Diagnosis not present

## 2018-04-07 DIAGNOSIS — F17210 Nicotine dependence, cigarettes, uncomplicated: Secondary | ICD-10-CM | POA: Insufficient documentation

## 2018-04-07 DIAGNOSIS — Z79899 Other long term (current) drug therapy: Secondary | ICD-10-CM | POA: Diagnosis not present

## 2018-04-07 MED ORDER — OXYCODONE-ACETAMINOPHEN 5-325 MG PO TABS: 1.0000 | ORAL_TABLET | Freq: Four times a day (QID) | ORAL | 0 refills | Status: DC | PRN

## 2018-04-07 MED ORDER — LIDOCAINE HCL (PF) 1 % IJ SOLN
10.0000 mL | Freq: Once | INTRAMUSCULAR | Status: AC
  Administered 2018-04-07: 10 mL
  Filled 2018-04-07: qty 30

## 2018-04-07 MED ORDER — OXYCODONE-ACETAMINOPHEN 5-325 MG PO TABS
1.0000 | ORAL_TABLET | ORAL | Status: DC | PRN
  Administered 2018-04-07: 1 via ORAL
  Filled 2018-04-07: qty 1

## 2018-04-07 NOTE — Discharge Instructions (Addendum)
You were evaluated in the emergency department for an abscess in your right axilla.  I made an incision to open the area and released a lot of pus.  There is a fabric wick in there now to keep the area open.  This can be removed in 2 or 3 days.  You can get the area wet in the shower.  Please return if any worsening symptoms.

## 2018-04-07 NOTE — ED Triage Notes (Signed)
Pt states that she started to feel it tingle three days ago, now its very sore and starting to drain on it's own

## 2018-04-07 NOTE — ED Provider Notes (Signed)
[facility]-EMERGENCY DEPT Provider Note   CSN: [id number] Arrival date & time: 04/07/18  [id number]     History   Chief Complaint Chief Complaint  Patient presents with  . Abscess    HPI Gabriella Murphy is a 38 y.o. female.  38 year old female with no significant past medical history here with an abscess under her right axilla.  She said she noticed that started couple of days ago and she is been trying some warm compresses but it greatly increased in size overnight.  She is not painful worse with palpation.  This been draining a little pus.  No fevers but she is felt some chills.  No nausea no vomiting.  She says she has had these before but not in a long time.   Abscess  Location:  Shoulder/arm Shoulder/arm abscess location:  R axilla Size:  5 Abscess quality: draining, induration, painful and redness   Progression:  Worsening Pain details:    Quality:  Pressure   Severity:  Moderate   Timing:  Constant   Progression:  Worsening Chronicity:  Recurrent Context: not diabetes and not injected drug use   Relieved by:  Nothing Worsened by:  Nothing Ineffective treatments:  Warm compresses Associated symptoms: no fever, no headaches, no nausea and no vomiting     History reviewed. No pertinent past medical history.  There are no active problems to display for this patient.   Past Surgical History:  Procedure Laterality Date  . BREAST SURGERY       OB History   No obstetric history on file.      Home Medications    Prior to Admission medications   Medication Sig Start Date End Date Taking? Authorizing Provider  hydrochlorothiazide (HYDRODIURIL) 12.5 MG tablet Take 1 tablet (12.5 mg total) by mouth daily. 11/24/17   Doristine BosworthStallings, Zoe A, MD  HYDROcodone-acetaminophen (NORCO/VICODIN) 5-325 MG tablet Take 1 tablet by mouth every 8 (eight) hours.    [provider]  predniSONE (DELTASONE) 10 MG tablet Take 10 mg by mouth 5 days.    [provider]    Family History Family History  Problem Relation Age of Onset  . Hyperlipidemia Mother   . Diabetes Maternal Grandmother   . Hyperlipidemia Maternal Grandmother   . Heart disease Maternal Grandfather   . Hyperlipidemia Maternal Grandfather   . Stroke Maternal Grandfather   . Diabetes Paternal Grandmother   . Heart disease Paternal Grandmother   . Hyperlipidemia Paternal Grandmother   . Stroke Paternal Grandmother     Social History Social History   Tobacco Use  . Smoking status: Current Every Day Smoker    Packs/day: 0.50    Types: Cigarettes  . Smokeless tobacco: Never Used  Substance Use Topics  . Alcohol use: Yes    Comment: occassional  . Drug use: No     Allergies   Patient has no known allergies.   Review of Systems Review of Systems  Constitutional: Negative for fever.  HENT: Negative for sore throat.   Eyes: Negative for visual disturbance.  Respiratory: Negative for shortness of breath.   Cardiovascular: Negative for chest pain.  Gastrointestinal: Negative for abdominal pain, nausea and vomiting.  Genitourinary: Negative for dysuria.  Musculoskeletal: Negative for neck pain.  Skin: Negative for rash.  Neurological: Negative for headaches.     Physical Exam Updated Vital Signs BP (!) 175/106 (BP Location: Left Arm) Comment: pt states hx of HTN  Pulse 85   Temp 98.7 F (37.1  C) (Oral)   Resp 15   Ht 5\' 6"  (1.676 m)   Wt 112 kg   LMP 04/07/2018   SpO2 100%   BMI 39.85 kg/m   Physical Exam Constitutional:      Appearance: She is well-developed.  HENT:     Head: Normocephalic and atraumatic.  Eyes:     Conjunctiva/sclera: Conjunctivae normal.  Neck:     Musculoskeletal: Neck supple.  Cardiovascular:     Rate and Rhythm: Normal rate and regular rhythm.  Pulmonary:     Effort: Pulmonary effort is normal.  Abdominal:     Tenderness: There is no abdominal tenderness.  Musculoskeletal: Normal range of motion.      Comments: Approximately 5 x 3 cm abscess in her right axilla.  There is some induration of the tissue and a small area of drainage with some yellow-brown pus.  It is diffusely tender.  Skin:    General: Skin is warm and dry.     Capillary Refill: Capillary refill takes less than 2 seconds.  Neurological:     General: No focal deficit present.     Mental Status: She is alert.     GCS: GCS eye subscore is 4. GCS verbal subscore is 5. GCS motor subscore is 6.      ED Treatments / Results  Labs (all labs ordered are listed, but only abnormal results are displayed) Labs Reviewed - No data to display  EKG None  Radiology No results found.  Procedures .Marland Kitchen.Incision and Drainage Date/Time: 04/07/2018 7:41 AM Performed by: Terrilee FilesButler,  C, MD Authorized by: Terrilee FilesButler,  C, MD   Consent:    Consent obtained:  Verbal   Consent given by:  Patient   Risks discussed:  Bleeding, incomplete drainage, pain and infection   Alternatives discussed:  No treatment, delayed treatment and referral Location:    Type:  Abscess   Size:  5   Location:  Upper extremity   Upper extremity location: axilla right. Pre-procedure details:    Skin preparation:  Betadine Anesthesia (see MAR for exact dosages):    Anesthesia method:  Local infiltration   Local anesthetic:  Lidocaine 1% w/o epi Procedure type:    Complexity:  Simple Procedure details:    Incision types:  Single straight   Scalpel blade:  15   Wound management:  Probed and deloculated   Drainage:  Purulent   Drainage amount:  Moderate   Packing materials:  1/2 in iodoform gauze   Amount 1/2" iodoform:  15 Post-procedure details:    Patient tolerance of procedure:  Tolerated well, no immediate complications   (including critical care time)  Medications Ordered in ED Medications  oxyCODONE-acetaminophen (PERCOCET/ROXICET) 5-325 MG per tablet 1 tablet (1 tablet Oral Given 04/07/18 0645)  lidocaine (PF) (XYLOCAINE) 1 % injection 10  mL (has no administration in time range)     Initial Impression / Assessment and Plan / ED Course  I have reviewed the triage vital signs and the nursing notes.  Pertinent labs & imaging results that were available during my care of the patient were reviewed by me and considered in my medical decision making (see chart for details).      Final Clinical Impressions(s) / ED Diagnoses   Final diagnoses:  Abscess of axilla, right    ED Discharge Orders         Ordered    oxyCODONE-acetaminophen (PERCOCET/ROXICET) 5-325 MG tablet  Every 6 hours PRN     04/07/18 0745  Terrilee Files, MD 04/08/18 [phone]

## 2018-05-15 ENCOUNTER — Encounter: Payer: Self-pay | Admitting: Family Medicine

## 2018-05-15 ENCOUNTER — Ambulatory Visit (INDEPENDENT_AMBULATORY_CARE_PROVIDER_SITE_OTHER): Payer: BLUE CROSS/BLUE SHIELD | Admitting: Family Medicine

## 2018-05-15 ENCOUNTER — Other Ambulatory Visit: Payer: Self-pay

## 2018-05-15 VITALS — BP 145/98 | HR 70 | Temp 98.7°F | Resp 17 | Ht 66.0 in | Wt 249.0 lb

## 2018-05-15 DIAGNOSIS — E66812 Obesity, class 2: Secondary | ICD-10-CM

## 2018-05-15 DIAGNOSIS — Z0001 Encounter for general adult medical examination with abnormal findings: Secondary | ICD-10-CM | POA: Diagnosis not present

## 2018-05-15 DIAGNOSIS — Z6839 Body mass index (BMI) 39.0-39.9, adult: Secondary | ICD-10-CM

## 2018-05-15 DIAGNOSIS — I10 Essential (primary) hypertension: Secondary | ICD-10-CM

## 2018-05-15 DIAGNOSIS — Z833 Family history of diabetes mellitus: Secondary | ICD-10-CM

## 2018-05-15 DIAGNOSIS — Z124 Encounter for screening for malignant neoplasm of cervix: Secondary | ICD-10-CM

## 2018-05-15 DIAGNOSIS — Z113 Encounter for screening for infections with a predominantly sexual mode of transmission: Secondary | ICD-10-CM

## 2018-05-15 DIAGNOSIS — Z0000 Encounter for general adult medical examination without abnormal findings: Secondary | ICD-10-CM

## 2018-05-15 DIAGNOSIS — M05772 Rheumatoid arthritis with rheumatoid factor of left ankle and foot without organ or systems involvement: Secondary | ICD-10-CM

## 2018-05-15 DIAGNOSIS — N898 Other specified noninflammatory disorders of vagina: Secondary | ICD-10-CM

## 2018-05-15 DIAGNOSIS — F17200 Nicotine dependence, unspecified, uncomplicated: Secondary | ICD-10-CM

## 2018-05-15 DIAGNOSIS — Z131 Encounter for screening for diabetes mellitus: Secondary | ICD-10-CM

## 2018-05-15 LAB — POCT URINE PREGNANCY: PREG TEST UR: NEGATIVE

## 2018-05-15 LAB — POCT WET + KOH PREP
Trich by wet prep: ABSENT
YEAST BY KOH: ABSENT
Yeast by wet prep: ABSENT

## 2018-05-15 MED ORDER — HYDROCHLOROTHIAZIDE 12.5 MG PO TABS: 12.5000 mg | ORAL_TABLET | Freq: Every day | ORAL | 1 refills | Status: DC

## 2018-05-15 MED ORDER — FLUCONAZOLE 150 MG PO TABS
150.0000 mg | ORAL_TABLET | Freq: Once | ORAL | 0 refills | Status: AC
Start: 2018-05-15 — End: 2018-05-15

## 2018-05-15 NOTE — Progress Notes (Signed)
Chief Complaint  Patient presents with  . Annual Exam    cpe with pap.  Pt bp is elevated today    Subjective:  Gabriella Murphy is a 38 y.o. female here for a health maintenance visit.  Patient is established pt   Smokes a pack a day Has not quit In high school  Patient's last menstrual period was 05/11/2018. Itchy vaginal discharge    There are no active problems to display for this patient.   No past medical history on file.  Past Surgical History:  Procedure Laterality Date  . BREAST SURGERY       Outpatient Medications Prior to Visit  Medication Sig Dispense Refill  . etanercept (ENBREL) 25 MG injection Inject 25 mg into the skin.    . hydrochlorothiazide (HYDRODIURIL) 12.5 MG tablet Take 1 tablet (12.5 mg total) by mouth daily. 30 tablet 6  . HYDROcodone-acetaminophen (NORCO/VICODIN) 5-325 MG tablet Take 1 tablet by mouth every 8 (eight) hours.    Marland Kitchen oxyCODONE-acetaminophen (PERCOCET/ROXICET) 5-325 MG tablet Take 1-2 tablets by mouth every 6 (six) hours as needed for severe pain. (Patient not taking: Reported on 05/15/2018) 10 tablet 0  . predniSONE (DELTASONE) 10 MG tablet Take 10 mg by mouth 5 days.     No facility-administered medications prior to visit.     No Known Allergies   Family History  Problem Relation Age of Onset  . Hyperlipidemia Mother   . Diabetes Maternal Grandmother   . Hyperlipidemia Maternal Grandmother   . Heart disease Maternal Grandfather   . Hyperlipidemia Maternal Grandfather   . Stroke Maternal Grandfather   . Diabetes Paternal Grandmother   . Heart disease Paternal Grandmother   . Hyperlipidemia Paternal Grandmother   . Stroke Paternal Grandmother      Health Habits: Dental Exam: up to date Eye Exam: up to date Exercise: 0 times/week on average Current exercise activities: walking/running Diet:   Social History   Socioeconomic History  . Marital status: Single    Spouse name: Not on file  . Number of children: Not on  file  . Years of education: Not on file  . Highest education level: Not on file  Occupational History  . Not on file  Social Needs  . Financial resource strain: Not on file  . Food insecurity:    Worry: Not on file    Inability: Not on file  . Transportation needs:    Medical: Not on file    Non-medical: Not on file  Tobacco Use  . Smoking status: Current Every Day Smoker    Packs/day: 0.50    Types: Cigarettes  . Smokeless tobacco: Never Used  Substance and Sexual Activity  . Alcohol use: Yes    Comment: occassional  . Drug use: No  . Sexual activity: Not on file  Lifestyle  . Physical activity:    Days per week: Not on file    Minutes per session: Not on file  . Stress: Not on file  Relationships  . Social connections:    Talks on phone: Not on file    Gets together: Not on file    Attends religious service: Not on file    Active member of club or organization: Not on file    Attends meetings of clubs or organizations: Not on file    Relationship status: Not on file  . Intimate partner violence:    Fear of current or ex partner: Not on file    Emotionally abused: Not on  file    Physically abused: Not on file    Forced sexual activity: Not on file  Other Topics Concern  . Not on file  Social History Narrative  . Not on file   Social History   Substance and Sexual Activity  Alcohol Use Yes   Comment: occassional   Social History   Tobacco Use  Smoking Status Current Every Day Smoker  . Packs/day: 0.50  . Types: Cigarettes  Smokeless Tobacco Never Used   Social History   Substance and Sexual Activity  Drug Use No    GYN: Sexual Health Menstrual status: regular menses LMP: Patient's last menstrual period was 05/11/2018. Last pap smear: see HM section History of abnormal pap smears:  Sexually active: with female partner   Health Maintenance: See under health Maintenance activity for review of completion dates as well.  There is no immunization  history on file for this patient.    Depression Screen-PHQ2/9 Depression screen [facility] 2/9 05/15/2018 11/24/2017 09/28/2015 01/19/2015  Decreased Interest 0 0 0 0  Down, Depressed, Hopeless 0 0 0 0  PHQ - 2 Score 0 0 0 0       Depression Severity and Treatment Recommendations:  0-4= None  5-9= Mild / Treatment: Support, educate to call if worse; return in one month  10-14= Moderate / Treatment: Support, watchful waiting; Antidepressant or Psycotherapy  15-19= Moderately severe / Treatment: Antidepressant OR Psychotherapy  >= 20 = Major depression, severe / Antidepressant AND Psychotherapy    Review of Systems   ROS  See HPI for ROS as well.  Review of Systems  Constitutional: Negative for activity change, appetite change, chills and fever.  HENT: Negative for congestion, nosebleeds, trouble swallowing and voice change.   Respiratory: Negative for cough, shortness of breath and wheezing.   Gastrointestinal: Negative for diarrhea, nausea and vomiting.  Genitourinary: Negative for difficulty urinating, dysuria, flank pain and hematuria.  Musculoskeletal: Negative for back pain, joint swelling and neck pain.  Neurological: Negative for dizziness, speech difficulty, light-headedness and numbness.  See HPI. All other review of systems negative.    Objective:   Vitals:   05/15/18 0841  BP: (!) 145/98  Pulse: 70  Resp: 17  Temp: 98.7 F (37.1 C)  TempSrc: Oral  SpO2: 99%  Weight: 249 lb (112.9 kg)  Height: _0  (1.676 m)    Body mass index is 40.19 kg/m.  Physical Exam  BP (!) 145/98 (BP Location: Right Arm, Patient Position: Sitting, Cuff Size: Large)   Pulse 70   Temp 98.7 F (37.1 C) (Oral)   Resp 17   Ht _1  (1.676 m)   Wt 249 lb (112.9 kg)   LMP 05/11/2018   SpO2 99%   BMI 40.19 kg/m   Chaperone present General Appearance:    Alert, cooperative, no distress, appears stated age  Head:    Normocephalic, without obvious abnormality, atraumatic  Eyes:     conjunctiva/corneas clear, EOM's intact  Ears:    Normal TM's and external ear canals, both ears  Nose:   Nares normal, septum midline, mucosa normal, no drainage    or sinus tenderness  Throat:   Lips, mucosa, and tongue normal; teeth and gums normal  Neck:   Supple, symmetrical, trachea midline, no adenopathy;    thyroid:  no enlargement/tenderness/nodules; no carotid   bruit or JVD  Back:     Symmetric, no curvature, ROM normal, no CVA tenderness  Lungs:     Clear to auscultation bilaterally,  respirations unlabored  Chest Wall:    No tenderness or deformity   Heart:    Regular rate and rhythm, S1 and S2 normal, no murmur, rub   or gallop  Breast Exam:    No tenderness, masses, or nipple abnormality  Abdomen:     Soft, non-tender, bowel sounds active all four quadrants,    no masses, no organomegaly  Genitalia:    Normal female without lesion, + thick white curdlike discharge, without tenderness Pap smear performed, normal bimanual exam without cmt or ovarian masses  Extremities:   Extremities normal, atraumatic, no cyanosis or edema  Pulses:   2+ and symmetric all extremities  Skin:   Skin color, texture, turgor normal, no rashes or lesions  Lymph nodes:   Cervical, supraclavicular, and axillary nodes normal  Neurologic:   CNII-XII intact, normal strength, sensation and reflexes    throughout      Assessment/Plan:   Patient was seen for a health maintenance exam.  Counseled the patient on health maintenance issues. Reviewed her health mainteance schedule and ordered appropriate tests (see orders.) Counseled on regular exercise and weight management. Recommend regular eye exams and dental cleaning.   The following issues were addressed today for health maintenance:   Shakiah was seen today for annual exam.  Diagnoses and all orders for this visit:  Essential hypertension- Patient's blood pressure is at goal of 139/89 or less. Condition is stable. Continue current medications  and treatment plan. I recommend that you exercise for 30-45 minutes 5 days a week. I also recommend a balanced diet with fruits and vegetables every day, lean meats, and little fried foods. The DASH diet (you can find this online) is a good example of this. Smoking cessation advised -     Lipid panel -     CMP14+EGFR -     hydrochlorothiazide (HYDRODIURIL) 12.5 MG tablet; Take 1 tablet (12.5 mg total) by mouth daily.  Class 2 severe obesity due to excess calories with serious comorbidity and body mass index (BMI) of 39.0 to 39.9 in adult [facility])  Papanicolaou smear for cervical cancer screening -     Pap IG, CT/NG NAA, and HPV (high risk) [facility]  Vaginal discharge -     POCT Wet + KOH Prep -     POCT urine pregnancy -     fluconazole (DIFLUCAN) 150 MG tablet; Take 1 tablet (150 mg total) by mouth once for 1 dose. 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    Body mass index is 40.19 kg/m.:  Discussed the patient's BMI with patient. The BMI body mass index is 40.19 kg/m.     No future appointments.  There are no Patient Instructions on file for this visit.

## 2018-05-16 LAB — CMP14+EGFR
ALT: 18 IU/L (ref 0–32)
AST: 16 IU/L (ref 0–40)
Albumin/Globulin Ratio: 1.4 (ref 1.2–2.2)
Albumin: 4.3 g/dL (ref 3.8–4.8)
Alkaline Phosphatase: 38 IU/L — ABNORMAL LOW (ref 39–117)
BUN/Creatinine Ratio: 10 (ref 9–23)
BUN: 9 mg/dL (ref 6–20)
Bilirubin Total: 0.4 mg/dL (ref 0.0–1.2)
CO2: 21 mmol/L (ref 20–29)
CREATININE: 0.9 mg/dL (ref 0.57–1.00)
Calcium: 9.5 mg/dL (ref 8.7–10.2)
Chloride: 103 mmol/L (ref 96–106)
GFR calc Af Amer: 94 mL/min/{1.73_m2} (ref >59)
GFR calc non Af Amer: 82 mL/min/{1.73_m2} (ref >59)
GLUCOSE: 81 mg/dL (ref 65–99)
Globulin, Total: 3.1 g/dL (ref 1.5–4.5)
Potassium: 4.6 mmol/L (ref 3.5–5.2)
Sodium: 138 mmol/L (ref 134–144)
Total Protein: 7.4 g/dL (ref 6.0–8.5)

## 2018-05-16 LAB — RPR: RPR: NONREACTIVE

## 2018-05-16 LAB — LIPID PANEL
CHOL/HDL RATIO: 2.8 ratio (ref 0.0–4.4)
Cholesterol, Total: 205 mg/dL — ABNORMAL HIGH (ref 100–199)
HDL: 73 mg/dL (ref >39)
LDL Calculated: 119 mg/dL — ABNORMAL HIGH (ref 0–99)
Triglycerides: 63 mg/dL (ref 0–149)
VLDL Cholesterol Cal: 13 mg/dL (ref 5–40)

## 2018-05-16 LAB — HEPATITIS B SURFACE ANTIGEN: Hepatitis B Surface Ag: NEGATIVE

## 2018-05-16 LAB — HEMOGLOBIN A1C
Est. average glucose Bld gHb Est-mCnc: 103 mg/dL
Hgb A1c MFr Bld: 5.2 % (ref 4.8–5.6)

## 2018-05-16 LAB — HIV ANTIBODY (ROUTINE TESTING W REFLEX): HIV SCREEN 4TH GENERATION: NONREACTIVE

## 2018-05-17 MED ORDER — METRONIDAZOLE 500 MG PO TABS: 500.0000 mg | ORAL_TABLET | Freq: Two times a day (BID) | ORAL | 0 refills | Status: DC

## 2018-05-20 LAB — PAP IG, CT-NG NAA, HPV HIGH-RISK
Chlamydia, Nuc. Acid Amp: NEGATIVE
Gonococcus by Nucleic Acid Amp: NEGATIVE
HPV, HIGH-RISK: NEGATIVE

## 2018-05-26 ENCOUNTER — Encounter: Payer: Self-pay | Admitting: Radiology

## 2019-03-17 ENCOUNTER — Other Ambulatory Visit: Payer: Self-pay | Admitting: Family Medicine

## 2019-03-17 DIAGNOSIS — I10 Essential (primary) hypertension: Secondary | ICD-10-CM

## 2019-06-25 ENCOUNTER — Ambulatory Visit
Admission: EM | Admit: 2019-06-25 | Discharge: 2019-06-25 | Disposition: A | Discharge status: Home / Self Care | Payer: Self-pay | Attending: Family Medicine | Admitting: Family Medicine

## 2019-06-25 DIAGNOSIS — R05 Cough: Secondary | ICD-10-CM

## 2019-06-25 DIAGNOSIS — K0889 Other specified disorders of teeth and supporting structures: Secondary | ICD-10-CM

## 2019-06-25 DIAGNOSIS — R058 Other specified cough: Secondary | ICD-10-CM

## 2019-06-25 DIAGNOSIS — Z20822 Contact with and (suspected) exposure to covid-19: Secondary | ICD-10-CM

## 2019-06-25 HISTORY — DX: Essential (primary) hypertension: I10

## 2019-06-25 MED ORDER — AMOXICILLIN-POT CLAVULANATE 875-125 MG PO TABS: 1.0000 | ORAL_TABLET | Freq: Two times a day (BID) | ORAL | 0 refills | Status: DC

## 2019-06-25 NOTE — Discharge Instructions (Addendum)
We have tested you for Covid. We will call you with any positive results or you can check your MyChart for results. You can try using some over-the-counter allergy or antihistamine for symptoms You can continue the ibuprofen as needed Follow up as needed for continued or worsening symptoms

## 2019-06-25 NOTE — ED Triage Notes (Signed)
Pt c/o slight cough, congestion and loss of taste/smell x1wk and had a positive covid exposure 2 days prior to that. Pt states needs covid testing for work.

## 2019-06-25 NOTE — ED Provider Notes (Signed)
[facility]    CSN: [id number] Arrival date & time: 06/25/19  1140      History   Chief Complaint Chief Complaint  Patient presents with  . Nasal Congestion    HPI Gabriella Murphy is a 39 y.o. female.   Patient is a 39 year old female past medical history of hypertension.  She was associated with slight cough, congestion loss of taste and smell x1 week.  Reporting positive Covid exposure 2 days prior.  Reporting needs Covid testing for work.  She is also having left lower dental pain.  This has been constant for the past week also.  She has been using Orajel without much relief.  It is located to the top back of mouth on the right. Some pain in right facial area. No swelling.  No fever, chills, body aches or night sweats.  No cough or chest congestion.  No chest pain or shortness of breath.  ROS per HPI      Past Medical History:  Diagnosis Date  . Hypertension     There are no problems to display for this patient.   Past Surgical History:  Procedure Laterality Date  . BREAST SURGERY      OB History   No obstetric history on file.      Home Medications    Prior to Admission medications   Medication Sig Start Date End Date Taking? Authorizing Provider  amoxicillin-clavulanate (AUGMENTIN) 875-125 MG tablet Take 1 tablet by mouth every 12 (twelve) hours. 06/25/19   Dahlia Byes A, NP  hydrochlorothiazide (HYDRODIURIL) 12.5 MG tablet Take 1 tablet by mouth once daily 03/17/19   Doristine Bosworth, MD    Family History Family History  Problem Relation Age of Onset  . Hyperlipidemia Mother   . Diabetes Maternal Grandmother   . Hyperlipidemia Maternal Grandmother   . Heart disease Maternal Grandfather   . Hyperlipidemia Maternal Grandfather   . Stroke Maternal Grandfather   . Diabetes Paternal Grandmother   . Heart disease Paternal Grandmother   . Hyperlipidemia Paternal Grandmother   . Stroke Paternal Grandmother     Social History Social  History   Tobacco Use  . Smoking status: Current Every Day Smoker    Packs/day: 0.50    Types: Cigarettes  . Smokeless tobacco: Never Used  Substance Use Topics  . Alcohol use: Yes    Comment: occassional  . Drug use: No     Allergies   Patient has no known allergies.   Review of Systems Review of Systems   Physical Exam Triage Vital Signs ED Triage Vitals [06/25/19 1157]  Enc Vitals Group     BP (!) 177/106     Pulse Rate 76     Resp 18     Temp (!) 97.3 F (36.3 C)     Temp src      SpO2 96 %     Weight      Height      Head Circumference      Peak Flow      Pain Score 0     Pain Loc      Pain Edu?      Excl. in GC?    No data found.  Updated Vital Signs BP (!) 177/106 (BP Location: Left Arm)   Pulse 76   Temp (!) 97.3 F (36.3 C)   Resp 18   LMP 06/17/2019   SpO2 96%   Visual Acuity Right Eye Distance:   Left Eye  Distance:   Bilateral Distance:    Right Eye Near:   Left Eye Near:    Bilateral Near:     Physical Exam Vitals and nursing note reviewed.  Constitutional:      General: She is not in acute distress.    Appearance: Normal appearance. She is not ill-appearing, toxic-appearing or diaphoretic.  HENT:     Head: Normocephalic.     Right Ear: Tympanic membrane and ear canal normal.     Left Ear: Tympanic membrane and ear canal normal.     Nose: Nose normal.     Mouth/Throat:     Pharynx: Oropharynx is clear.   Eyes:     Conjunctiva/sclera: Conjunctivae normal.  Pulmonary:     Effort: Pulmonary effort is normal.  Abdominal:     Palpations: Abdomen is soft.     Tenderness: There is no abdominal tenderness.  Musculoskeletal:        General: Normal range of motion.     Cervical back: Normal range of motion.  Skin:    General: Skin is warm and dry.     Findings: No rash.  Neurological:     Mental Status: She is alert.  Psychiatric:        Mood and Affect: Mood normal.      UC Treatments / Results  Labs (all labs  ordered are listed, but only abnormal results are displayed) Labs Reviewed  NOVEL CORONAVIRUS, NAA    EKG   Radiology No results found.  Procedures Procedures (including critical care time)  Medications Ordered in UC Medications - No data to display  Initial Impression / Assessment and Plan / UC Course  I have reviewed the triage vital signs and the nursing notes.  Pertinent labs & imaging results that were available during my care of the patient were reviewed by me and considered in my medical decision making (see chart for details).     Exposure to COVID-19-Covid swab sent for testing. Recommend over-the-counter medication to include antihistamine for symptoms  Dental pain-treating with Augmentin Ibuprofen as needed Follow up as needed for continued or worsening symptoms  Final Clinical Impressions(s) / UC Diagnoses   Final diagnoses:  Cough with exposure to COVID-19 virus  Pain, dental     Discharge Instructions     We have tested you for Covid. We will call you with any positive results or you can check your MyChart for results. You can try using some over-the-counter allergy or antihistamine for symptoms You can continue the ibuprofen as needed Follow up as needed for continued or worsening symptoms     ED Prescriptions    Medication Sig Dispense Auth. Provider   amoxicillin-clavulanate (AUGMENTIN) 875-125 MG tablet Take 1 tablet by mouth every 12 (twelve) hours. 14 tablet Swayzee Wadley A, NP     PDMP not reviewed this encounter.   Orvan July, NP 06/25/19 1334

## 2019-06-26 LAB — SARS-COV-2, NAA 2 DAY TAT

## 2019-06-26 LAB — NOVEL CORONAVIRUS, NAA: SARS-CoV-2, NAA: NOT DETECTED

## 2020-05-22 ENCOUNTER — Other Ambulatory Visit: Payer: Self-pay

## 2020-05-22 ENCOUNTER — Encounter (HOSPITAL_COMMUNITY): Payer: Self-pay | Admitting: Emergency Medicine

## 2020-05-22 ENCOUNTER — Emergency Department (HOSPITAL_COMMUNITY)
Admission: EM | Admit: 2020-05-22 | Discharge: 2020-05-22 | Disposition: A | Discharge status: Home / Self Care | Payer: Self-pay | Attending: Emergency Medicine | Admitting: Emergency Medicine

## 2020-05-22 ENCOUNTER — Emergency Department (HOSPITAL_COMMUNITY): Payer: Self-pay

## 2020-05-22 DIAGNOSIS — Z5321 Procedure and treatment not carried out due to patient leaving prior to being seen by health care provider: Secondary | ICD-10-CM | POA: Insufficient documentation

## 2020-05-22 DIAGNOSIS — R079 Chest pain, unspecified: Secondary | ICD-10-CM | POA: Insufficient documentation

## 2020-05-22 DIAGNOSIS — R0602 Shortness of breath: Secondary | ICD-10-CM | POA: Insufficient documentation

## 2020-05-22 LAB — CBC
HCT: 35 % — ABNORMAL LOW (ref 36.0–46.0)
Hemoglobin: 11.4 g/dL — ABNORMAL LOW (ref 12.0–15.0)
MCH: 29.2 pg (ref 26.0–34.0)
MCHC: 32.6 g/dL (ref 30.0–36.0)
MCV: 89.5 fL (ref 80.0–100.0)
Platelets: 365 10*3/uL (ref 150–400)
RBC: 3.91 MIL/uL (ref 3.87–5.11)
RDW: 14.9 % (ref 11.5–15.5)
WBC: 8 10*3/uL (ref 4.0–10.5)
nRBC: 0 % (ref 0.0–0.2)

## 2020-05-22 LAB — BASIC METABOLIC PANEL
Anion gap: 12 (ref 5–15)
BUN: 8 mg/dL (ref 6–20)
CO2: 21 mmol/L — ABNORMAL LOW (ref 22–32)
Calcium: 9.3 mg/dL (ref 8.9–10.3)
Chloride: 100 mmol/L (ref 98–111)
Creatinine, Ser: 0.86 mg/dL (ref 0.44–1.00)
GFR, Estimated: >60 mL/min (ref >60)
Glucose, Bld: 75 mg/dL (ref 70–99)
Potassium: 4.5 mmol/L (ref 3.5–5.1)
Sodium: 133 mmol/L — ABNORMAL LOW (ref 135–145)

## 2020-05-22 LAB — TROPONIN I (HIGH SENSITIVITY): Troponin I (High Sensitivity): 9 ng/L (ref <18)

## 2020-05-22 NOTE — ED Notes (Signed)
Pt left due to not being seen quick enough 

## 2020-05-22 NOTE — ED Triage Notes (Signed)
Pt arrives to ED with c/o left sided chest pain, described as sharp/tight that started this morning. Pain comes and goes, radiates to right arm. Mild SOB on exertion, no N/V/D.

## 2020-09-21 ENCOUNTER — Other Ambulatory Visit: Payer: Self-pay | Admitting: Nurse Practitioner

## 2020-09-21 DIAGNOSIS — Z1231 Encounter for screening mammogram for malignant neoplasm of breast: Secondary | ICD-10-CM

## 2020-11-13 ENCOUNTER — Ambulatory Visit: Payer: Self-pay

## 2021-02-16 ENCOUNTER — Emergency Department (HOSPITAL_COMMUNITY)
Admission: EM | Admit: 2021-02-16 | Discharge: 2021-02-16 | Disposition: A | Discharge status: Home / Self Care | Payer: Self-pay | Attending: Emergency Medicine | Admitting: Emergency Medicine

## 2021-02-16 ENCOUNTER — Other Ambulatory Visit: Payer: Self-pay

## 2021-02-16 ENCOUNTER — Encounter (HOSPITAL_COMMUNITY): Payer: Self-pay

## 2021-02-16 DIAGNOSIS — Z79899 Other long term (current) drug therapy: Secondary | ICD-10-CM | POA: Insufficient documentation

## 2021-02-16 DIAGNOSIS — I10 Essential (primary) hypertension: Secondary | ICD-10-CM | POA: Insufficient documentation

## 2021-02-16 DIAGNOSIS — L02412 Cutaneous abscess of left axilla: Secondary | ICD-10-CM | POA: Insufficient documentation

## 2021-02-16 DIAGNOSIS — F17210 Nicotine dependence, cigarettes, uncomplicated: Secondary | ICD-10-CM | POA: Insufficient documentation

## 2021-02-16 DIAGNOSIS — L732 Hidradenitis suppurativa: Secondary | ICD-10-CM | POA: Insufficient documentation

## 2021-02-16 MED ORDER — OXYCODONE-ACETAMINOPHEN 5-325 MG PO TABS
2.0000 | ORAL_TABLET | Freq: Once | ORAL | Status: AC
  Administered 2021-02-16: 2 via ORAL
  Filled 2021-02-16: qty 2

## 2021-02-16 MED ORDER — FENTANYL CITRATE PF 50 MCG/ML IJ SOSY
50.0000 ug | PREFILLED_SYRINGE | Freq: Once | INTRAMUSCULAR | Status: AC
  Administered 2021-02-16: 50 ug via INTRAMUSCULAR
  Filled 2021-02-16: qty 1

## 2021-02-16 MED ORDER — ACETAMINOPHEN 325 MG PO TABS
650.0000 mg | ORAL_TABLET | Freq: Once | ORAL | Status: AC
  Administered 2021-02-16: 650 mg via ORAL
  Filled 2021-02-16: qty 2

## 2021-02-16 MED ORDER — DOXYCYCLINE HYCLATE 100 MG PO CAPS: 100.0000 mg | ORAL_CAPSULE | Freq: Two times a day (BID) | ORAL | 0 refills | Status: AC

## 2021-02-16 MED ORDER — LIDOCAINE-EPINEPHRINE (PF) 2 %-1:200000 IJ SOLN
10.0000 mL | Freq: Once | INTRAMUSCULAR | Status: DC
  Filled 2021-02-16: qty 20

## 2021-02-16 NOTE — ED Triage Notes (Signed)
Patient c/o an abscess to the left axilla x 3 days.

## 2021-02-16 NOTE — ED Provider Notes (Signed)
[facility]-EMERGENCY DEPT Provider Note   CSN: [id number] Arrival date & time: 02/16/21  3154     History Chief Complaint  Patient presents with   Abscess    Gabriella Murphy is a 40 y.o. female the past medical history significant for hidradenitis, recurrent axillary abscesses who presents with new abscess the left axilla for the last 3 days.  Patient reports worsening pain over the 3 days, now 10/10.  Patient denies any fever, chills, purulent drainage.  Patient denies taking immunosuppressive medication for her at bedtime, just is evaluated and has her cyst drained periodically.  Patient denies any other pain, chest pain, shortness of breath.  Patient reports she gets these recurrent cysts, abscesses multiple times monthly.   Abscess     Past Medical History:  Diagnosis Date   Hypertension     There are no problems to display for this patient.   Past Surgical History:  Procedure Laterality Date   BREAST SURGERY       OB History   No obstetric history on file.     Family History  Problem Relation Age of Onset   Hyperlipidemia Mother    Diabetes Maternal Grandmother    Hyperlipidemia Maternal Grandmother    Heart disease Maternal Grandfather    Hyperlipidemia Maternal Grandfather    Stroke Maternal Grandfather    Diabetes Paternal Grandmother    Heart disease Paternal Grandmother    Hyperlipidemia Paternal Grandmother    Stroke Paternal Grandmother     Social History   Tobacco Use   Smoking status: Every Day    Packs/day: 0.50    Types: Cigarettes   Smokeless tobacco: Never  Vaping Use   Vaping Use: Some days   Substances: Nicotine  Substance Use Topics   Alcohol use: Yes    Comment: occassional   Drug use: No    Home Medications Prior to Admission medications   Medication Sig Start Date End Date Taking? Authorizing Provider  doxycycline (VIBRAMYCIN) 100 MG capsule Take 1 capsule (100 mg total) by mouth 2 (two) times daily.  02/16/21  Yes Jaylenne Hamelin H, PA-C  hydrochlorothiazide (HYDRODIURIL) 12.5 MG tablet Take 1 tablet by mouth once daily 03/17/19   Doristine Bosworth, MD    Allergies    Patient has no known allergies.  Review of Systems   Review of Systems  All other systems reviewed and are negative.  Physical Exam Updated Vital Signs BP (!) 174/127 (BP Location: Right Arm)   Pulse 94   Temp 98.6 F (37 C) (Oral)   Resp 16   Ht 5\' 6"  (1.676 m)   Wt 112 kg   LMP 02/16/2021   SpO2 100%   BMI 39.87 kg/m   Physical Exam Vitals and nursing note reviewed.  Constitutional:      General: She is in acute distress.     Appearance: Normal appearance. She is not ill-appearing.     Comments: Patient in significant distress, with significant pain, but overall well-appearing  HENT:     Head: Normocephalic and atraumatic.  Eyes:     General:        Right eye: No discharge.        Left eye: No discharge.  Cardiovascular:     Rate and Rhythm: Normal rate and regular rhythm.  Pulmonary:     Effort: Pulmonary effort is normal. No respiratory distress.  Musculoskeletal:        General: No deformity.  Skin:    General:  Skin is warm and dry.     Comments: Approximately 3 to 4 cm tender fluctuant fluid collection in the left axilla.  There is no evidence of surrounding redness or cellulitis.  Neurological:     Mental Status: She is alert and oriented to person, place, and time.  Psychiatric:        Mood and Affect: Mood normal.        Behavior: Behavior normal.    ED Results / Procedures / Treatments   Labs (all labs ordered are listed, but only abnormal results are displayed) Labs Reviewed - No data to display  EKG None  Radiology No results found.  Procedures .Marland KitchenIncision and Drainage  Date/Time: 02/16/2021 11:36 AM Performed by: Olene Floss, PA-C Authorized by: Olene Floss, PA-C   Consent:    Consent obtained:  Verbal   Consent given by:  Patient   Risks,  benefits, and alternatives were discussed: yes     Risks discussed:  Bleeding, incomplete drainage, infection and pain   Alternatives discussed:  No treatment Universal protocol:    Procedure explained and questions answered to patient or proxy's satisfaction: yes     Patient identity confirmed:  Verbally with patient Location:    Type:  Abscess   Size:  3.5cm   Location:  Upper extremity   Upper extremity location: left axilla. Pre-procedure details:    Skin preparation:  Povidone-iodine Sedation:    Sedation type:  None Anesthesia:    Anesthesia method:  Local infiltration   Local anesthetic:  Lidocaine 2% WITH epi Procedure type:    Complexity:  Simple Procedure details:    Ultrasound guidance: no     Needle aspiration: no     Incision types:  Single straight   Incision depth:  Dermal   Wound management:  Probed and deloculated   Drainage:  Purulent and bloody   Drainage amount:  Moderate   Wound treatment:  Wound left open   Packing materials:  None Post-procedure details:    Procedure completion:  Tolerated   Medications Ordered in ED Medications  lidocaine-EPINEPHrine (XYLOCAINE W/EPI) 2 %-1:200000 (PF) injection 10 mL (has no administration in time range)  fentaNYL (SUBLIMAZE) injection 50 mcg (50 mcg Intramuscular Given 02/16/21 1006)  acetaminophen (TYLENOL) tablet 650 mg (650 mg Oral Given 02/16/21 1005)  oxyCODONE-acetaminophen (PERCOCET/ROXICET) 5-325 MG per tablet 2 tablet (2 tablets Oral Given 02/16/21 1113)    ED Course  I have reviewed the triage vital signs and the nursing notes.  Pertinent labs & imaging results that were available during my care of the patient were reviewed by me and considered in my medical decision making (see chart for details).    MDM Rules/Calculators/A&P                         Overall well-appearing patient in some acute distress who presents with recurrent abscess in the axillary secondary to chronic hidradenitis suppurativa  infection.  Abscess of the left axilla was drained as described in the procedure note above.  Patient's pain controlled prior to discharge, patient discharged with prescription for doxycycline.  Encourage patient to follow-up tablet care with dermatologist, discussed that there are immunosuppressive medications at this time that may help control abscess outbreaks and hidradenitis, I do recommend that patient look into these treatments.  Patient understands and agrees to plan, discharged in stable condition.  Return precautions given. Final Clinical Impression(s) / ED Diagnoses Final diagnoses:  Abscess of  left axilla  Hidradenitis suppurativa    Rx / DC Orders ED Discharge Orders          Ordered    doxycycline (VIBRAMYCIN) 100 MG capsule  2 times daily        02/16/21 1105             Ashana Tullo, [place] H, PA-C 02/16/21 1139    Alvira Monday, MD 02/17/21 0715

## 2021-02-16 NOTE — Discharge Instructions (Addendum)
Please use Tylenol or ibuprofen for pain.  You may use 600 mg ibuprofen every 6 hours or 1000 mg of Tylenol every 6 hours.  You may choose to alternate between the 2.  This would be most effective.  Not to exceed 4 g of Tylenol within 24 hours.  Not to exceed 3200 mg ibuprofen 24 hours.  

## 2021-12-26 ENCOUNTER — Other Ambulatory Visit: Payer: Self-pay | Admitting: Family

## 2021-12-26 DIAGNOSIS — Z1231 Encounter for screening mammogram for malignant neoplasm of breast: Secondary | ICD-10-CM

## 2022-05-27 IMAGING — DX DG CHEST 2V
2 series · 2 of 2 positions shown · non-contrast
Comparison: June 30, 2017

CLINICAL DATA: Shortness of breath and chest pain

EXAM:
CHEST - 2 VIEW

[chest pa]
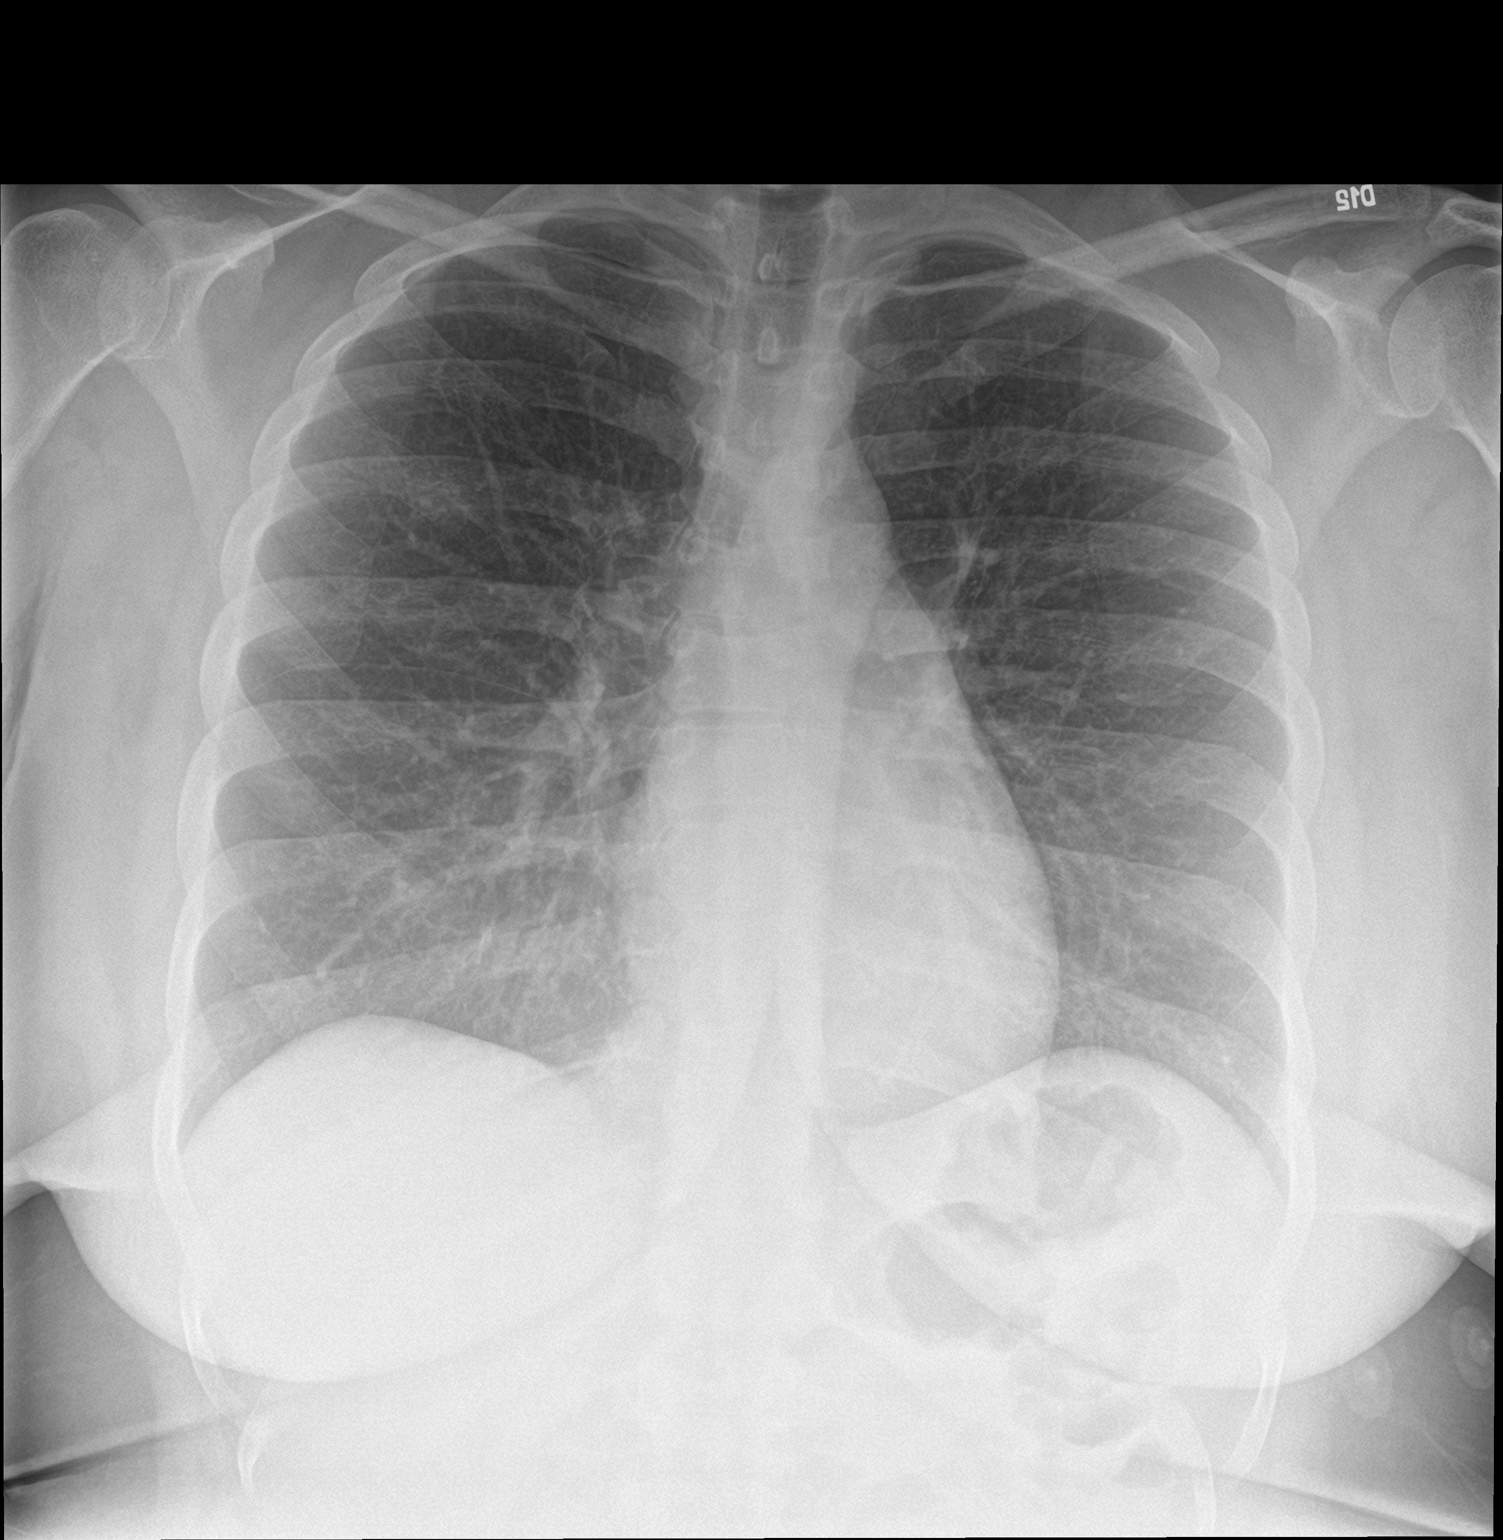

[chest lat]
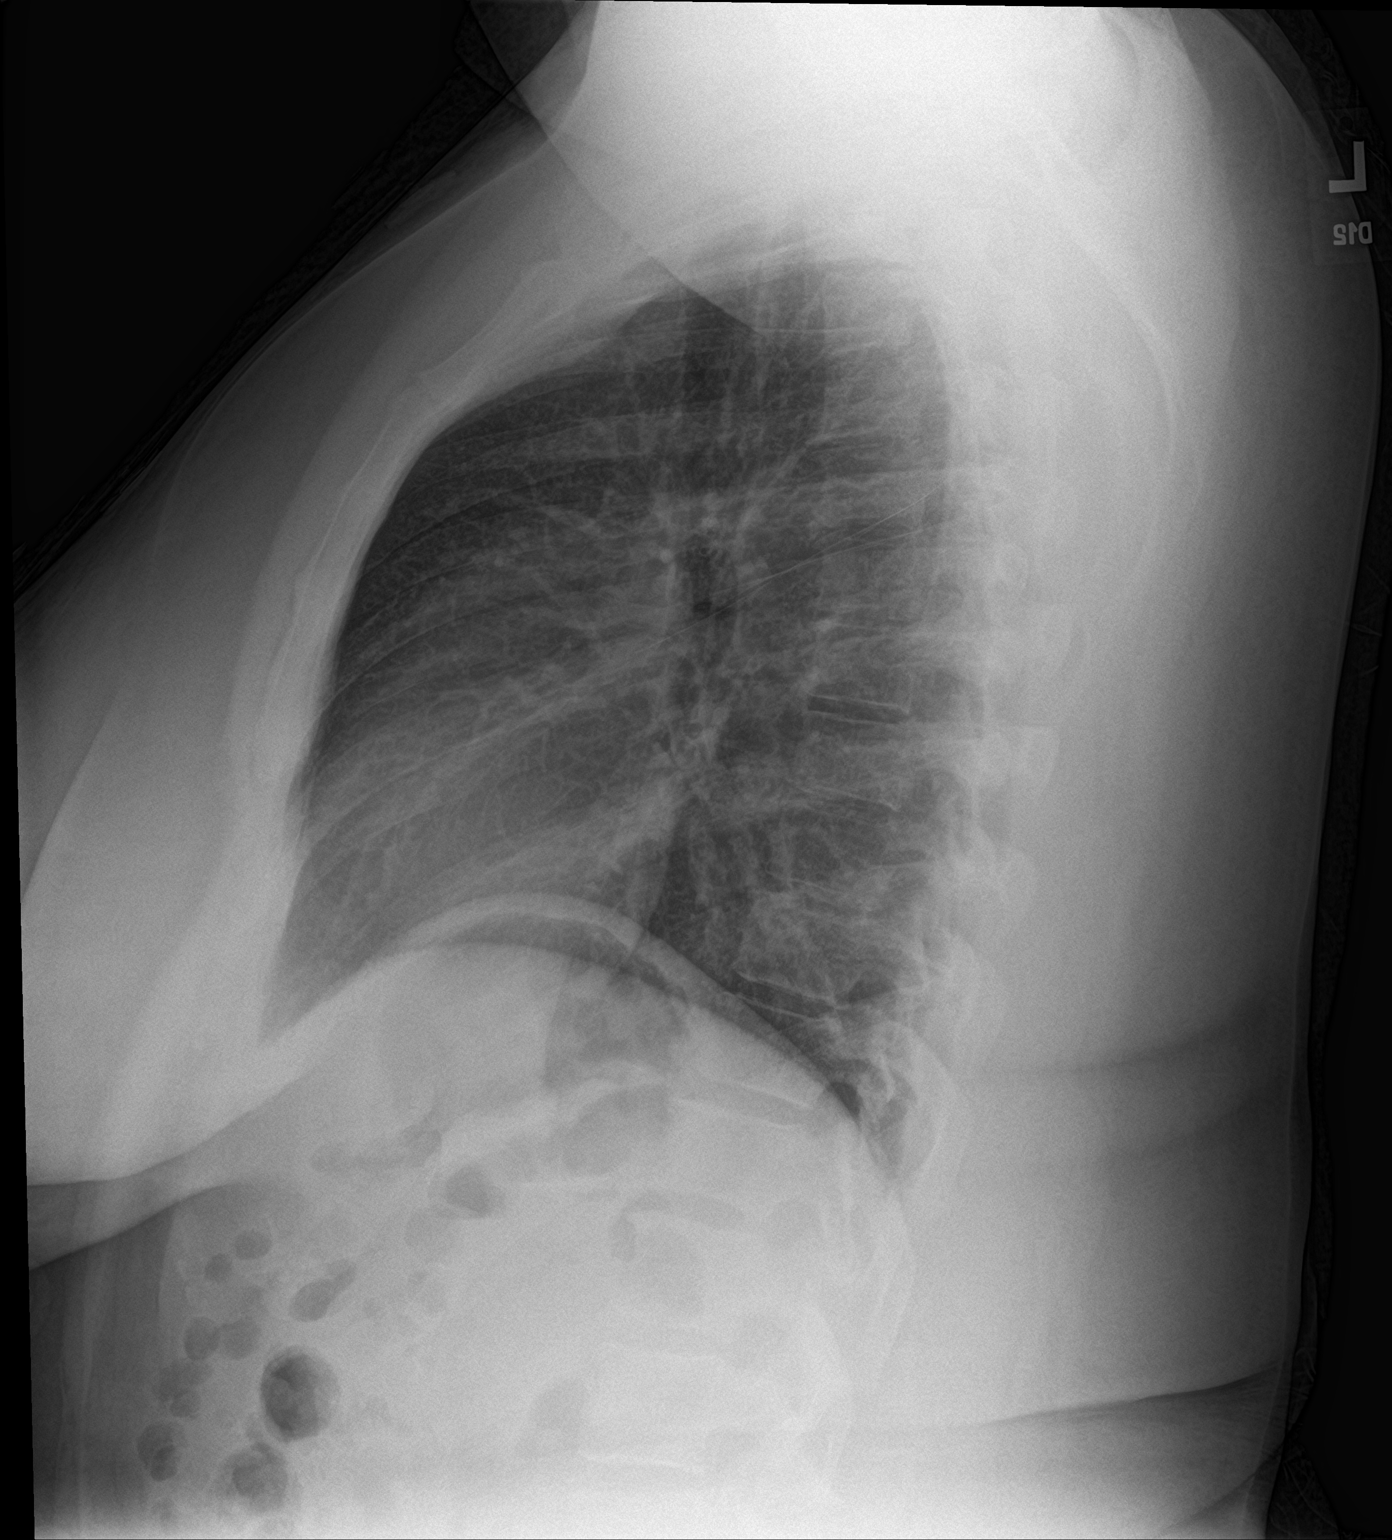

[2 of 2 positions shown; findings below may reference images not displayed]

FINDINGS: Lungs are clear. Heart size and pulmonary vascularity are normal. No
adenopathy. No pneumothorax. Rudimentary cervical rib on each side.
IMPRESSION: Lungs clear.  Cardiac silhouette normal.
